# Patient Record
Sex: Female | Born: 1965 | Race: Black or African American | Hispanic: No | Marital: Single | Smoking: Never smoker
Health system: Southern US, Community
[De-identification: ages and names within clinical notes are randomized; demographics above are authoritative.]

## PROBLEM LIST (undated history)

## (undated) DIAGNOSIS — J45909 Unspecified asthma, uncomplicated: Secondary | ICD-10-CM

## (undated) DIAGNOSIS — M549 Dorsalgia, unspecified: Secondary | ICD-10-CM

## (undated) DIAGNOSIS — E782 Mixed hyperlipidemia: Secondary | ICD-10-CM

## (undated) DIAGNOSIS — I10 Essential (primary) hypertension: Secondary | ICD-10-CM

## (undated) DIAGNOSIS — N393 Stress incontinence (female) (male): Secondary | ICD-10-CM

## (undated) HISTORY — DX: Stress incontinence (female) (male): N39.3

## (undated) HISTORY — DX: Mixed hyperlipidemia: E78.2

## (undated) HISTORY — PX: HX PARTIAL HYSTERECTOMY: SHX80

## (undated) HISTORY — PX: HX HYSTERECTOMY: SHX81

## (undated) HISTORY — DX: Dorsalgia, unspecified: M54.9

## (undated) HISTORY — PX: ABDOMINAL HYSTERECTOMY: SHX81

---

## 2005-08-28 ENCOUNTER — Emergency Department: Payer: Self-pay | Admitting: Unknown Physician Specialty

## 2005-08-30 ENCOUNTER — Ambulatory Visit: Payer: Self-pay | Admitting: Unknown Physician Specialty

## 2005-11-19 ENCOUNTER — Emergency Department: Payer: Self-pay | Admitting: Emergency Medicine

## 2006-12-12 ENCOUNTER — Emergency Department: Payer: Self-pay | Admitting: Internal Medicine

## 2007-02-14 ENCOUNTER — Ambulatory Visit: Payer: Self-pay | Admitting: Internal Medicine

## 2009-08-01 ENCOUNTER — Emergency Department: Payer: Self-pay | Admitting: Emergency Medicine

## 2011-10-08 ENCOUNTER — Emergency Department: Payer: Self-pay | Admitting: Emergency Medicine

## 2011-10-08 LAB — CBC WITH DIFFERENTIAL/PLATELET
Basophil #: 0 10*3/uL (ref 0.0–0.1)
Basophil %: 0.2 %
Eosinophil #: 0 10*3/uL (ref 0.0–0.7)
Eosinophil %: 0.6 %
HCT: 40.2 % (ref 35.0–47.0)
HGB: 12.7 g/dL (ref 12.0–16.0)
Lymphocyte #: 1 10*3/uL (ref 1.0–3.6)
Lymphocyte %: 14.2 %
MCH: 26.6 pg (ref 26.0–34.0)
MCHC: 31.7 g/dL — ABNORMAL LOW (ref 32.0–36.0)
MCV: 84 fL (ref 80–100)
Monocyte #: 0.5 x10 3/mm (ref 0.2–0.9)
Monocyte %: 6.7 %
Neutrophil #: 5.4 10*3/uL (ref 1.4–6.5)
Neutrophil %: 78.3 %
Platelet: 332 10*3/uL (ref 150–440)
RBC: 4.78 10*6/uL (ref 3.80–5.20)
RDW: 14.4 % (ref 11.5–14.5)
WBC: 6.9 10*3/uL (ref 3.6–11.0)

## 2011-10-08 LAB — URINALYSIS, COMPLETE
Bacteria: NONE SEEN
Bilirubin,UR: NEGATIVE
Glucose,UR: NEGATIVE mg/dL (ref 0–75)
Ketone: NEGATIVE
Leukocyte Esterase: NEGATIVE
Nitrite: NEGATIVE
Ph: 6 (ref 4.5–8.0)
Protein: NEGATIVE
RBC,UR: 4 /HPF (ref 0–5)
Specific Gravity: 1.008 (ref 1.003–1.030)
Squamous Epithelial: 3
WBC UR: 1 /HPF (ref 0–5)

## 2011-10-08 LAB — COMPREHENSIVE METABOLIC PANEL
Albumin: 3.5 g/dL (ref 3.4–5.0)
Alkaline Phosphatase: 83 U/L (ref 50–136)
Anion Gap: 7 (ref 7–16)
BUN: 6 mg/dL — ABNORMAL LOW (ref 7–18)
Bilirubin,Total: 0.4 mg/dL (ref 0.2–1.0)
Calcium, Total: 8.2 mg/dL — ABNORMAL LOW (ref 8.5–10.1)
Chloride: 106 mmol/L (ref 98–107)
Co2: 26 mmol/L (ref 21–32)
Creatinine: 0.75 mg/dL (ref 0.60–1.30)
EGFR (African American): >60
EGFR (Non-African Amer.): >60
Glucose: 94 mg/dL (ref 65–99)
Osmolality: 275 (ref 275–301)
Potassium: 3.2 mmol/L — ABNORMAL LOW (ref 3.5–5.1)
SGOT(AST): 18 U/L (ref 15–37)
SGPT (ALT): 18 U/L
Sodium: 139 mmol/L (ref 136–145)
Total Protein: 7.3 g/dL (ref 6.4–8.2)

## 2011-10-08 LAB — PREGNANCY, URINE: Pregnancy Test, Urine: NEGATIVE m[IU]/mL

## 2011-10-08 LAB — LIPASE, BLOOD: Lipase: 58 U/L — ABNORMAL LOW (ref 73–393)

## 2011-11-05 ENCOUNTER — Emergency Department: Payer: Self-pay | Admitting: Emergency Medicine

## 2012-01-29 ENCOUNTER — Emergency Department: Payer: Self-pay | Admitting: Emergency Medicine

## 2012-01-29 LAB — CBC
HCT: 37.5 % (ref 35.0–47.0)
HGB: 12.6 g/dL (ref 12.0–16.0)
MCH: 27.7 pg (ref 26.0–34.0)
MCHC: 33.7 g/dL (ref 32.0–36.0)
MCV: 82 fL (ref 80–100)
Platelet: 384 10*3/uL (ref 150–440)
RBC: 4.56 10*6/uL (ref 3.80–5.20)
RDW: 14.1 % (ref 11.5–14.5)
WBC: 9.8 10*3/uL (ref 3.6–11.0)

## 2012-01-29 LAB — COMPREHENSIVE METABOLIC PANEL
Albumin: 3.5 g/dL (ref 3.4–5.0)
Alkaline Phosphatase: 83 U/L (ref 50–136)
Anion Gap: 8 (ref 7–16)
BUN: 11 mg/dL (ref 7–18)
Bilirubin,Total: 0.3 mg/dL (ref 0.2–1.0)
Calcium, Total: 8.2 mg/dL — ABNORMAL LOW (ref 8.5–10.1)
Chloride: 110 mmol/L — ABNORMAL HIGH (ref 98–107)
Co2: 23 mmol/L (ref 21–32)
Creatinine: 0.71 mg/dL (ref 0.60–1.30)
EGFR (African American): >60
EGFR (Non-African Amer.): >60
Glucose: 73 mg/dL (ref 65–99)
Osmolality: 279 (ref 275–301)
Potassium: 3.7 mmol/L (ref 3.5–5.1)
SGOT(AST): 17 U/L (ref 15–37)
SGPT (ALT): 16 U/L (ref 12–78)
Sodium: 141 mmol/L (ref 136–145)
Total Protein: 7.4 g/dL (ref 6.4–8.2)

## 2012-01-29 LAB — HCG, QUANTITATIVE, PREGNANCY: Beta Hcg, Quant.: <1 m[IU]/mL — ABNORMAL LOW

## 2012-02-21 ENCOUNTER — Emergency Department: Payer: Self-pay | Admitting: Emergency Medicine

## 2012-02-21 LAB — CBC
HCT: 37.3 % (ref 35.0–47.0)
HGB: 12.3 g/dL (ref 12.0–16.0)
MCH: 27.2 pg (ref 26.0–34.0)
MCHC: 33 g/dL (ref 32.0–36.0)
MCV: 82 fL (ref 80–100)
Platelet: 346 10*3/uL (ref 150–440)
RBC: 4.52 10*6/uL (ref 3.80–5.20)
RDW: 13.8 % (ref 11.5–14.5)
WBC: 10.3 10*3/uL (ref 3.6–11.0)

## 2012-02-21 LAB — COMPREHENSIVE METABOLIC PANEL
Albumin: 3.7 g/dL (ref 3.4–5.0)
Alkaline Phosphatase: 76 U/L (ref 50–136)
Anion Gap: 8 (ref 7–16)
BUN: 7 mg/dL (ref 7–18)
Bilirubin,Total: 0.3 mg/dL (ref 0.2–1.0)
Calcium, Total: 8.9 mg/dL (ref 8.5–10.1)
Chloride: 108 mmol/L — ABNORMAL HIGH (ref 98–107)
Co2: 24 mmol/L (ref 21–32)
Creatinine: 0.63 mg/dL (ref 0.60–1.30)
EGFR (African American): >60
EGFR (Non-African Amer.): >60
Glucose: 85 mg/dL (ref 65–99)
Osmolality: 277 (ref 275–301)
Potassium: 3.8 mmol/L (ref 3.5–5.1)
SGOT(AST): 12 U/L — ABNORMAL LOW (ref 15–37)
SGPT (ALT): 18 U/L (ref 12–78)
Sodium: 140 mmol/L (ref 136–145)
Total Protein: 7.8 g/dL (ref 6.4–8.2)

## 2012-02-21 LAB — LIPASE, BLOOD: Lipase: 80 U/L (ref 73–393)

## 2012-02-21 LAB — TROPONIN I: Troponin-I: <0.02 ng/mL

## 2012-02-22 LAB — URINALYSIS, COMPLETE
Bacteria: NONE SEEN
Bilirubin,UR: NEGATIVE
Glucose,UR: NEGATIVE mg/dL (ref 0–75)
Leukocyte Esterase: NEGATIVE
Nitrite: NEGATIVE
Ph: 6 (ref 4.5–8.0)
Protein: NEGATIVE
RBC,UR: 28 /HPF (ref 0–5)
Specific Gravity: 1.026 (ref 1.003–1.030)
Squamous Epithelial: 5
WBC UR: 1 /HPF (ref 0–5)

## 2012-08-24 DIAGNOSIS — D219 Benign neoplasm of connective and other soft tissue, unspecified: Secondary | ICD-10-CM | POA: Insufficient documentation

## 2012-08-24 DIAGNOSIS — N939 Abnormal uterine and vaginal bleeding, unspecified: Secondary | ICD-10-CM | POA: Insufficient documentation

## 2012-09-08 DIAGNOSIS — K5900 Constipation, unspecified: Secondary | ICD-10-CM | POA: Insufficient documentation

## 2013-01-14 ENCOUNTER — Emergency Department: Payer: Self-pay | Admitting: Internal Medicine

## 2013-05-11 ENCOUNTER — Emergency Department: Payer: Self-pay | Admitting: Emergency Medicine

## 2013-05-11 LAB — URINALYSIS, COMPLETE
Bacteria: NONE SEEN
Bilirubin,UR: NEGATIVE
Glucose,UR: NEGATIVE mg/dL (ref 0–75)
Ketone: NEGATIVE
Leukocyte Esterase: NEGATIVE
Nitrite: NEGATIVE
Ph: 6 (ref 4.5–8.0)
Protein: NEGATIVE
RBC,UR: 12 /HPF (ref 0–5)
Specific Gravity: 1.024 (ref 1.003–1.030)
Squamous Epithelial: <1
WBC UR: <1 /HPF (ref 0–5)

## 2013-05-11 LAB — CBC
HCT: 35.5 % (ref 35.0–47.0)
HGB: 11.8 g/dL — ABNORMAL LOW (ref 12.0–16.0)
MCH: 27.2 pg (ref 26.0–34.0)
MCHC: 33.3 g/dL (ref 32.0–36.0)
MCV: 82 fL (ref 80–100)
Platelet: 293 10*3/uL (ref 150–440)
RBC: 4.35 10*6/uL (ref 3.80–5.20)
RDW: 14.2 % (ref 11.5–14.5)
WBC: 9.2 10*3/uL (ref 3.6–11.0)

## 2013-05-11 LAB — GC/CHLAMYDIA PROBE AMP

## 2013-05-11 LAB — WET PREP, GENITAL

## 2013-07-01 ENCOUNTER — Emergency Department: Payer: Self-pay | Admitting: Emergency Medicine

## 2013-07-01 LAB — WET PREP, GENITAL

## 2013-07-01 LAB — URINALYSIS, COMPLETE
Bilirubin,UR: NEGATIVE
Glucose,UR: NEGATIVE mg/dL (ref 0–75)
Ketone: NEGATIVE
Nitrite: NEGATIVE
Ph: 6 (ref 4.5–8.0)
Protein: 30
RBC,UR: 413 /HPF (ref 0–5)
Specific Gravity: 1.016 (ref 1.003–1.030)
Squamous Epithelial: 5
WBC UR: 118 /HPF (ref 0–5)

## 2013-07-01 LAB — GC/CHLAMYDIA PROBE AMP

## 2013-07-03 LAB — URINE CULTURE

## 2014-07-23 ENCOUNTER — Emergency Department
Admit: 2014-07-23 | Disposition: A | Discharge status: Home / Self Care | Payer: Self-pay | Admitting: Emergency Medicine

## 2014-08-28 ENCOUNTER — Encounter: Payer: Self-pay | Admitting: Emergency Medicine

## 2014-08-28 ENCOUNTER — Emergency Department
Admission: EM | Admit: 2014-08-28 | Discharge: 2014-08-28 | Disposition: A | Discharge status: Home / Self Care | Payer: Self-pay | Attending: Emergency Medicine | Admitting: Emergency Medicine

## 2014-08-28 DIAGNOSIS — S30814A Abrasion of vagina and vulva, initial encounter: Secondary | ICD-10-CM | POA: Insufficient documentation

## 2014-08-28 DIAGNOSIS — S39012A Strain of muscle, fascia and tendon of lower back, initial encounter: Secondary | ICD-10-CM | POA: Insufficient documentation

## 2014-08-28 DIAGNOSIS — X58XXXA Exposure to other specified factors, initial encounter: Secondary | ICD-10-CM | POA: Insufficient documentation

## 2014-08-28 DIAGNOSIS — Y998 Other external cause status: Secondary | ICD-10-CM | POA: Insufficient documentation

## 2014-08-28 DIAGNOSIS — Y9389 Activity, other specified: Secondary | ICD-10-CM | POA: Insufficient documentation

## 2014-08-28 DIAGNOSIS — Y9289 Other specified places as the place of occurrence of the external cause: Secondary | ICD-10-CM | POA: Insufficient documentation

## 2014-08-28 DIAGNOSIS — Y9301 Activity, walking, marching and hiking: Secondary | ICD-10-CM | POA: Insufficient documentation

## 2014-08-28 DIAGNOSIS — N9089 Other specified noninflammatory disorders of vulva and perineum: Secondary | ICD-10-CM | POA: Insufficient documentation

## 2014-08-28 DIAGNOSIS — N898 Other specified noninflammatory disorders of vagina: Secondary | ICD-10-CM | POA: Insufficient documentation

## 2014-08-28 HISTORY — DX: Unspecified asthma, uncomplicated: J45.909

## 2014-08-28 LAB — URINALYSIS COMPLETE WITH MICROSCOPIC (ARMC ONLY)
Bacteria, UA: NONE SEEN
Bilirubin Urine: NEGATIVE
Glucose, UA: NEGATIVE mg/dL
Ketones, ur: NEGATIVE mg/dL
Leukocytes, UA: NEGATIVE
Nitrite: NEGATIVE
Protein, ur: NEGATIVE mg/dL
Specific Gravity, Urine: 1.013 (ref 1.005–1.030)
pH: 6 (ref 5.0–8.0)

## 2014-08-28 LAB — WET PREP, GENITAL
Clue Cells Wet Prep HPF POC: NONE SEEN
Trich, Wet Prep: NONE SEEN
Yeast Wet Prep HPF POC: NONE SEEN

## 2014-08-28 MED ORDER — KETOROLAC TROMETHAMINE 10 MG PO TABS
20.0000 mg | ORAL_TABLET | Freq: Once | ORAL | Status: AC
  Administered 2014-08-28: 20 mg via ORAL

## 2014-08-28 MED ORDER — KETOROLAC TROMETHAMINE 10 MG PO TABS
ORAL_TABLET | ORAL | Status: AC
  Administered 2014-08-28: 20 mg via ORAL
  Filled 2014-08-28: qty 2

## 2014-08-28 MED ORDER — NAPROXEN 500 MG PO TBEC: 500.0000 mg | DELAYED_RELEASE_TABLET | Freq: Two times a day (BID) | ORAL | Status: DC

## 2014-08-28 NOTE — ED Notes (Signed)
Having lower back pain which radiates into right leg

## 2014-08-28 NOTE — ED Provider Notes (Signed)
[facility] Emergency Department Provider Note ?____________________________________________ ? Time seen: 6:18 PM on 08/28/2014 -----------------------------------------  I have reviewed the triage vital signs and the nursing notes. ________ HISTORY ? Chief Complaint Back Pain  HPI  Jacqueline Sanders is a 49 y.o. female with complaints of low back pain, that are worsened by walking over the last 2 weeks. She denies any specific injury or trauma, but was told by her therapist, that it may have been due to tension and spasms while sleeping. She denies dysuria, leg weakness, or distal paresthesias. She also was treated with complaint of noting some spotting of bright red blood on her incontinence pad today when she went to the bathroom. She denies any urinary symptoms, denies any recent sexual encounters, and denies any vulvar lesions or rashes. She is status post hysterectomy, so denies any abnormal vaginal bleeding at this time.  Past Medical History  Diagnosis Date  . Asthma    There are no active problems to display for this patient. ? History reviewed. No pertinent past surgical history. ? Current Outpatient Rx  Name  Route  Sig  Dispense  Refill  . naproxen (EC NAPROSYN) 500 MG EC tablet   Oral   Take 1 tablet (500 mg total) by mouth 2 (two) times daily with a meal.   30 tablet   0   ? Allergies Sulfa antibiotics ? History reviewed. No pertinent family history. ? Social History History  Substance Use Topics  . Smoking status: Never Smoker   . Smokeless tobacco: Not on file  . Alcohol Use: No   Review of Systems  Constitutional: Negative for fever. HEENT: Negative for head trauma, visual changes, sore throat. Cardiovascular: Negative for chest pain. Respiratory: Negative for shortness of breath. Musculoskeletal: Positive for back pain. Genitourinary: Denies dysuria, noted scant, BRB on pad Skin: Negative for rash. Neurological: Negative for  headaches, focal weakness or numbness.  10-point ROS otherwise negative. ____________________________________________  PHYSICAL EXAM:  VITAL SIGNS: ED Triage Vitals  Enc Vitals Group     BP 08/28/14 1731 160/95 mmHg     Pulse Rate 08/28/14 1731 67     Resp 08/28/14 1731 18     Temp 08/28/14 1731 97.7 F (36.5 C)     Temp Source 08/28/14 1731 Oral     SpO2 08/28/14 1731 99 %     Weight 08/28/14 1733 272 lb (123.378 kg)     Height 08/28/14 1731 5\' 8"  (1.727 m)     Head Cir --      Peak Flow --      Pain Score 08/28/14 1732 8     Pain Loc --      Pain Edu? --      Excl. in [place]? --    Constitutional: Alert and oriented. Well appearing and in no distress. HEENT:Normocephalic and atraumatic.  PERRL. Normal extraocular movements.  No congestion/rhinnorhea. Mucous membranes are moist. Neck: Supple. No cervical lymphadenopathy. Cardiovascular: Normal rate, regular rhythm. No murmurs, rubs, or gallops. Normal and symmetric distal pulses are present in all extremities.  Respiratory: Normal respiratory effort without tachypnea. Breath sounds are clear and equal bilaterally. No wheezes/rales/rhonchi. Gastrointestinal: Soft and nontender. No distention. No abdominal bruits. There is no CVA tenderness. Genitourinary: Normal external exam except for linear vulvar abrasion on the left labia minora. Pelvic exam reveals white vaginal discharge in the canal. No active bleeding or lesions noted. Wet prep collected. Musculoskeletal: Nontender with normal range of motion in all extremities. No  joint effusions.  No lower extremity tenderness nor edema. Lumbar spine without deformity, spasm, or step-off.  Mildly tender to palp along the lumbar paraspinals.  Neurologic:  Normal speech and language. CN II-XII grossly intact. No gait instability. Normal LE DTRs bilaterally.  Skin:  Skin is warm, dry and intact. No rash noted. Psychiatric: Mood and affect are normal. Patient exhibits appropriate insight and  judgment. _____ LABS  Labs Reviewed  WET PREP, GENITAL - Abnormal; Notable for the following:    WBC, Wet Prep HPF POC FEW (*)    All other components within normal limits  URINALYSIS COMPLETEWITH MICROSCOPIC ([facility])  - Abnormal; Notable for the following:    Color, Urine STRAW (*)    APPearance CLEAR (*)    Hgb urine dipstick 2+ (*)    Squamous Epithelial / LPF 0-5 (*)    All other components within normal limits  _____________ PROCEDURES ? Procedure(s) performed: None  Critical Care performed: None ______________________________________________________ INITIAL IMPRESSION / ASSESSMENT AND PLAN / ED COURSE ? Mechanical lumbar back pain s/p strain.  Minor vulvar abrasion, likely source of resolved vulvar bleeding. Negative wet prep & urine results to patient.    Pertinent labs & imaging results that were available during my care of the patient were reviewed by me and considered in my medical decision making (see chart for details).  ____________________________________________ FINAL CLINICAL IMPRESSION(S) / ED DIAGNOSES?  Final diagnoses:  Lumbar strain, initial encounter  Vulvar irritation      Melvenia Needles, PA-C 08/28/14 2045  Ahmed Prima, MD 08/29/14 [phone]

## 2014-08-28 NOTE — Discharge Instructions (Signed)
Lumbosacral Strain Lumbosacral strain is a strain of any of the parts that make up your lumbosacral vertebrae. Your lumbosacral vertebrae are the bones that make up the lower third of your backbone. Your lumbosacral vertebrae are held together by muscles and tough, fibrous tissue (ligaments).  CAUSES  A sudden blow to your back can cause lumbosacral strain. Also, anything that causes an excessive stretch of the muscles in the low back can cause this strain. This is typically seen when people exert themselves strenuously, fall, lift heavy objects, bend, or crouch repeatedly. RISK FACTORS  Physically demanding work.  Participation in pushing or pulling sports or sports that require a sudden twist of the back (tennis, golf, baseball).  Weight lifting.  Excessive lower back curvature.  Forward-tilted pelvis.  Weak back or abdominal muscles or both.  Tight hamstrings. SIGNS AND SYMPTOMS  Lumbosacral strain may cause pain in the area of your injury or pain that moves (radiates) down your leg.  DIAGNOSIS Your health care provider can often diagnose lumbosacral strain through a physical exam. In some cases, you may need tests such as X-ray exams.  TREATMENT  Treatment for your lower back injury depends on many factors that your clinician will have to evaluate. However, most treatment will include the use of anti-inflammatory medicines. HOME CARE INSTRUCTIONS   Avoid hard physical activities (tennis, racquetball, waterskiing) if you are not in proper physical condition for it. This may aggravate or create problems.  If you have a back problem, avoid sports requiring sudden body movements. Swimming and walking are generally safer activities.  Maintain good posture.  Maintain a healthy weight.  For acute conditions, you may put ice on the injured area.  Put ice in a plastic bag.  Place a towel between your skin and the bag.  Leave the ice on for 20 minutes, 2-3 times a day.  When the  low back starts healing, stretching and strengthening exercises may be recommended. SEEK MEDICAL CARE IF:  Your back pain is getting worse.  You experience severe back pain not relieved with medicines. SEEK IMMEDIATE MEDICAL CARE IF:   You have numbness, tingling, weakness, or problems with the use of your arms or legs.  There is a change in bowel or bladder control.  You have increasing pain in any area of the body, including your belly (abdomen).  You notice shortness of breath, dizziness, or feel faint.  You feel sick to your stomach (nauseous), are throwing up (vomiting), or become sweaty.  You notice discoloration of your toes or legs, or your feet get very cold. MAKE SURE YOU:   Understand these instructions.  Will watch your condition.  Will get help right away if you are not doing well or get worse. Document Released: 01/06/2005 Document Revised: 04/03/2013 Document Reviewed: 11/15/2012 [facility] Patient Information 2015 [place], [place]. This information is not intended to replace advice given to you by your health care provider. Make sure you discuss any questions you have with your health care provider.   Your exam and labs were normal today.  You should take the prescription med as directed.  Apply ice or moist heat as needed.  Follow-up with one of the community clinics for ongoing problems.

## 2014-08-28 NOTE — ED Notes (Signed)
Lower back pain, worse with walking; X 2 weeks-progressively worsening. Pt alert and oriented X4, active, cooperative, pt in NAD. RR even and unlabored, color WNL.

## 2015-02-14 ENCOUNTER — Emergency Department
Admission: EM | Admit: 2015-02-14 | Discharge: 2015-02-14 | Disposition: A | Discharge status: Home / Self Care | Payer: Self-pay | Attending: Emergency Medicine | Admitting: Emergency Medicine

## 2015-02-14 ENCOUNTER — Encounter: Payer: Self-pay | Admitting: *Deleted

## 2015-02-14 DIAGNOSIS — Z791 Long term (current) use of non-steroidal anti-inflammatories (NSAID): Secondary | ICD-10-CM | POA: Insufficient documentation

## 2015-02-14 DIAGNOSIS — R197 Diarrhea, unspecified: Secondary | ICD-10-CM

## 2015-02-14 DIAGNOSIS — R109 Unspecified abdominal pain: Secondary | ICD-10-CM | POA: Insufficient documentation

## 2015-02-14 LAB — COMPREHENSIVE METABOLIC PANEL
ALT: 17 U/L (ref 14–54)
AST: 18 U/L (ref 15–41)
Albumin: 3.7 g/dL (ref 3.5–5.0)
Alkaline Phosphatase: 76 U/L (ref 38–126)
Anion gap: 6 (ref 5–15)
BUN: 9 mg/dL (ref 6–20)
CO2: 28 mmol/L (ref 22–32)
Calcium: 8.9 mg/dL (ref 8.9–10.3)
Chloride: 105 mmol/L (ref 101–111)
Creatinine, Ser: 0.61 mg/dL (ref 0.44–1.00)
GFR calc Af Amer: >60 mL/min (ref >60)
GFR calc non Af Amer: >60 mL/min (ref >60)
Glucose, Bld: 68 mg/dL (ref 65–99)
Potassium: 3.9 mmol/L (ref 3.5–5.1)
Sodium: 139 mmol/L (ref 135–145)
Total Bilirubin: 0.4 mg/dL (ref 0.3–1.2)
Total Protein: 7.4 g/dL (ref 6.5–8.1)

## 2015-02-14 LAB — CBC
HCT: 35.8 % (ref 35.0–47.0)
Hemoglobin: 11.9 g/dL — ABNORMAL LOW (ref 12.0–16.0)
MCH: 27.3 pg (ref 26.0–34.0)
MCHC: 33.2 g/dL (ref 32.0–36.0)
MCV: 82.3 fL (ref 80.0–100.0)
Platelets: 326 10*3/uL (ref 150–440)
RBC: 4.35 MIL/uL (ref 3.80–5.20)
RDW: 14.1 % (ref 11.5–14.5)
WBC: 8.2 10*3/uL (ref 3.6–11.0)

## 2015-02-14 LAB — URINALYSIS COMPLETE WITH MICROSCOPIC (ARMC ONLY)
Bacteria, UA: NONE SEEN
Bilirubin Urine: NEGATIVE
Glucose, UA: NEGATIVE mg/dL
Ketones, ur: NEGATIVE mg/dL
Leukocytes, UA: NEGATIVE
Nitrite: NEGATIVE
Protein, ur: NEGATIVE mg/dL
Specific Gravity, Urine: 1.008 (ref 1.005–1.030)
pH: 6 (ref 5.0–8.0)

## 2015-02-14 LAB — LIPASE, BLOOD: Lipase: 25 U/L (ref 11–51)

## 2015-02-14 MED ORDER — LOPERAMIDE HCL 2 MG PO TABS: 2.0000 mg | ORAL_TABLET | Freq: Four times a day (QID) | ORAL | Status: DC | PRN

## 2015-02-14 NOTE — ED Notes (Signed)
Pt reports diarrhea that started today with abdominal cramping.

## 2015-02-14 NOTE — ED Provider Notes (Signed)
[facility] Emergency Department Provider Note  ____________________________________________  Time seen: On arrival  I have reviewed the triage vital signs and the nursing notes.   HISTORY  Chief Complaint Diarrhea    HPI Jacqueline Sanders is a 49 y.o. female who presents with complaints of diarrhea that started approximately 24 hours ago. She reports watery diarrhea without blood. She complains of mild abdominal cramping which is relieved by diarrhea. She denies nausea or vomiting. No fevers or chills. No recent travel or camping. She reports she has had this many times in the past. She has not recently been on antibiotics.She has not tried anything for this     Past Medical History  Diagnosis Date  . Asthma     There are no active problems to display for this patient.   Past Surgical History  Procedure Laterality Date  . Abdominal hysterectomy      Current Outpatient Rx  Name  Route  Sig  Dispense  Refill  . loperamide (IMODIUM A-D) 2 MG tablet   Oral   Take 1 tablet (2 mg total) by mouth 4 (four) times daily as needed for diarrhea or loose stools.   30 tablet   0   . naproxen (EC NAPROSYN) 500 MG EC tablet   Oral   Take 1 tablet (500 mg total) by mouth 2 (two) times daily with a meal.   30 tablet   0     Allergies Sulfa antibiotics  History reviewed. No pertinent family history.  Social History Social History  Substance Use Topics  . Smoking status: Never Smoker   . Smokeless tobacco: None  . Alcohol Use: No    Review of Systems  Constitutional: Negative for fever. Eyes: Negative for visual changes. ENT: Negative for sore throat Cardiovascular: Negative for chest pain. Respiratory: Negative for shortness of breath. Gastrointestinal: Negative for vomiting or nausea Genitourinary: Negative for dysuria. Musculoskeletal: Negative for back pain. Skin: Negative for rash. Neurological: Negative for headaches or focal  weakness Psychiatric: No anxiety    ____________________________________________   PHYSICAL EXAM:  VITAL SIGNS: ED Triage Vitals  Enc Vitals Group     BP 02/14/15 1900 170/97 mmHg     Pulse Rate 02/14/15 1900 66     Resp 02/14/15 1900 17     Temp 02/14/15 1900 98.2 F (36.8 C)     Temp Source 02/14/15 1900 Oral     SpO2 02/14/15 1900 100 %     Weight 02/14/15 1900 275 lb (124.739 kg)     Height 02/14/15 1900 5\' 8"  (1.727 m)     Head Cir --      Peak Flow --      Pain Score 02/14/15 1906 10     Pain Loc --      Pain Edu? --      Excl. in [place]? --      Constitutional: Alert and oriented. Well appearing and in no distress. Eyes: Conjunctivae are normal.  ENT   Head: Normocephalic and atraumatic.   Mouth/Throat: Mucous membranes are moist. Cardiovascular: Normal rate, regular rhythm. Normal and symmetric distal pulses are present in all extremities. No murmurs, rubs, or gallops. Respiratory: Normal respiratory effort without tachypnea nor retractions. Breath sounds are clear and equal bilaterally.  Gastrointestinal: Soft and non-tender in all quadrants. No distention. There is no CVA tenderness. Genitourinary: deferred Musculoskeletal: Nontender with normal range of motion in all extremities. No lower extremity tenderness nor edema. Neurologic:  Normal speech and  language. No gross focal neurologic deficits are appreciated. Skin:  Skin is warm, dry and intact. No rash noted. Psychiatric: Mood and affect are normal. Patient exhibits appropriate insight and judgment.  ____________________________________________    LABS (pertinent positives/negatives)  Labs Reviewed  CBC - Abnormal; Notable for the following:    Hemoglobin 11.9 (*)    All other components within normal limits  URINALYSIS COMPLETEWITH MICROSCOPIC ([facility] ONLY) - Abnormal; Notable for the following:    Color, Urine STRAW (*)    APPearance CLEAR (*)    Hgb urine dipstick 2+ (*)    Squamous  Epithelial / LPF 0-5 (*)    All other components within normal limits  LIPASE, BLOOD  COMPREHENSIVE METABOLIC PANEL    ____________________________________________   EKG  None  ____________________________________________    RADIOLOGY I have personally reviewed any xrays that were ordered on this patient: None  ____________________________________________   PROCEDURES  Procedure(s) performed: none  Critical Care performed: none  ____________________________________________   INITIAL IMPRESSION / ASSESSMENT AND PLAN / ED COURSE  Pertinent labs & imaging results that were available during my care of the patient were reviewed by me and considered in my medical decision making (see chart for details).  Patient well-appearing and in no distress. She has no tenderness palpation of her abdomen and her lab work all looks normal. I recommended supportive treatment at this time, I will write a prescription for Imodium and asked her to follow up with her primary care physician if she continues to have diarrhea  ____________________________________________   FINAL CLINICAL IMPRESSION(S) / ED DIAGNOSES  Final diagnoses:  Diarrhea, unspecified type     Lavonia Drafts, MD 02/14/15 2116

## 2015-02-14 NOTE — ED Notes (Signed)
Pt reports diarrhea w/ abd cramping x 2 days

## 2015-02-14 NOTE — Discharge Instructions (Signed)

## 2015-04-20 ENCOUNTER — Encounter: Payer: Self-pay | Admitting: Emergency Medicine

## 2015-04-20 ENCOUNTER — Emergency Department
Admission: EM | Admit: 2015-04-20 | Discharge: 2015-04-21 | Disposition: A | Discharge status: Home / Self Care | Payer: BLUE CROSS/BLUE SHIELD | Attending: Emergency Medicine | Admitting: Emergency Medicine

## 2015-04-20 DIAGNOSIS — Z791 Long term (current) use of non-steroidal anti-inflammatories (NSAID): Secondary | ICD-10-CM | POA: Diagnosis not present

## 2015-04-20 DIAGNOSIS — R1030 Lower abdominal pain, unspecified: Secondary | ICD-10-CM

## 2015-04-20 DIAGNOSIS — R42 Dizziness and giddiness: Secondary | ICD-10-CM | POA: Insufficient documentation

## 2015-04-20 DIAGNOSIS — A09 Infectious gastroenteritis and colitis, unspecified: Secondary | ICD-10-CM | POA: Diagnosis not present

## 2015-04-20 LAB — COMPREHENSIVE METABOLIC PANEL
ALT: 14 U/L (ref 14–54)
AST: 15 U/L (ref 15–41)
Albumin: 3.6 g/dL (ref 3.5–5.0)
Alkaline Phosphatase: 74 U/L (ref 38–126)
Anion gap: 6 (ref 5–15)
BUN: 8 mg/dL (ref 6–20)
CO2: 26 mmol/L (ref 22–32)
Calcium: 8 mg/dL — ABNORMAL LOW (ref 8.9–10.3)
Chloride: 106 mmol/L (ref 101–111)
Creatinine, Ser: 0.58 mg/dL (ref 0.44–1.00)
GFR calc Af Amer: >60 mL/min (ref >60)
GFR calc non Af Amer: >60 mL/min (ref >60)
Glucose, Bld: 98 mg/dL (ref 65–99)
Potassium: 3.2 mmol/L — ABNORMAL LOW (ref 3.5–5.1)
Sodium: 138 mmol/L (ref 135–145)
Total Bilirubin: 0.7 mg/dL (ref 0.3–1.2)
Total Protein: 7 g/dL (ref 6.5–8.1)

## 2015-04-20 LAB — CBC
HCT: 35.9 % (ref 35.0–47.0)
Hemoglobin: 11.9 g/dL — ABNORMAL LOW (ref 12.0–16.0)
MCH: 27.3 pg (ref 26.0–34.0)
MCHC: 33.1 g/dL (ref 32.0–36.0)
MCV: 82.2 fL (ref 80.0–100.0)
Platelets: 276 10*3/uL (ref 150–440)
RBC: 4.36 MIL/uL (ref 3.80–5.20)
RDW: 13.9 % (ref 11.5–14.5)
WBC: 6.2 10*3/uL (ref 3.6–11.0)

## 2015-04-20 LAB — URINALYSIS COMPLETE WITH MICROSCOPIC (ARMC ONLY)
Bilirubin Urine: NEGATIVE
Glucose, UA: NEGATIVE mg/dL
Ketones, ur: NEGATIVE mg/dL
Leukocytes, UA: NEGATIVE
Nitrite: NEGATIVE
Protein, ur: NEGATIVE mg/dL
Specific Gravity, Urine: 1.013 (ref 1.005–1.030)
pH: 6 (ref 5.0–8.0)

## 2015-04-20 LAB — LIPASE, BLOOD: Lipase: 20 U/L (ref 11–51)

## 2015-04-20 MED ORDER — SODIUM CHLORIDE 0.9 % IV BOLUS (SEPSIS)
1000.0000 mL | Freq: Once | INTRAVENOUS | Status: AC
  Administered 2015-04-21: 1000 mL via INTRAVENOUS

## 2015-04-20 NOTE — ED Notes (Addendum)
Pt c/o abdominal cramping since 2100 last night with diarrhea and nausea; lightheaded at times when first standing; denies urinary s/s; pt awake and alert; talking in complete coherent sentences; took prescrbed Imodiuim about 20 min pta

## 2015-04-20 NOTE — ED Provider Notes (Signed)
[facility] Emergency Department Provider Note  ____________________________________________  Time seen: Approximately 11:31 PM  I have reviewed the triage vital signs and the nursing notes.   HISTORY  Chief Complaint Abdominal Pain; Diarrhea; and Dizziness    HPI Jacqueline Sanders is a 50 y.o. female who comes into the hospital today with diarrhea and severe abdominal cramps. The patient reports that this started last night around 9:30 and she is unsure she ate anything bad. The patient reports she had made some chili and took a shower after eating. She reports that while showering the cramps started as well as diarrhea. The patient reports that she's had 10-15 episodes of diarrhea since last night. It was initially solid but turned up your liquid and water. The patient has been nauseous but has been able to eat some chicken noodle soup with ginger ale and eat her rate. She reports that she has not drank much and she does feel lightheaded when she gets up. The patient reports that the pain is stabbing in her lower abdomen and she rates the pain a 9 of 10 in intensity. The patient denies any vomiting. The patient also denies fevers. She has taken Imodium twice and she reports that it has not helped to calm the cramps or the diarrhea.   Past Medical History  Diagnosis Date  . Asthma     There are no active problems to display for this patient.   Past Surgical History  Procedure Laterality Date  . Abdominal hysterectomy      Current Outpatient Rx  Name  Route  Sig  Dispense  Refill  . ciprofloxacin (CIPRO) 500 MG tablet   Oral   Take 1 tablet (500 mg total) by mouth 2 (two) times daily.   6 tablet   0   . loperamide (IMODIUM A-D) 2 MG tablet   Oral   Take 1 tablet (2 mg total) by mouth 4 (four) times daily as needed for diarrhea or loose stools.   30 tablet   0   . naproxen (EC NAPROSYN) 500 MG EC tablet   Oral   Take 1 tablet (500 mg total) by  mouth 2 (two) times daily with a meal.   30 tablet   0     Allergies Sulfa antibiotics  History reviewed. No pertinent family history.  Social History Social History  Substance Use Topics  . Smoking status: Never Smoker   . Smokeless tobacco: None  . Alcohol Use: No    Review of Systems Constitutional: No fever/chills Eyes: No visual changes. ENT: No sore throat. Cardiovascular: Denies chest pain. Respiratory: Denies shortness of breath. Gastrointestinal:abdominal pain,  nausea, and  Diarrhea, no vomiting.  Genitourinary: Negative for dysuria. Musculoskeletal: Negative for back pain. Skin: Negative for rash. Neurological: Lightheadedness  10-point ROS otherwise negative.  ____________________________________________   PHYSICAL EXAM:  VITAL SIGNS: ED Triage Vitals  Enc Vitals Group     BP 04/20/15 2154 149/88 mmHg     Pulse Rate 04/20/15 2154 83     Resp 04/20/15 2154 18     Temp 04/20/15 2154 98 F (36.7 C)     Temp Source 04/20/15 2154 Oral     SpO2 04/20/15 2154 100 %     Weight 04/20/15 2154 277 lb 8 oz (125.873 kg)     Height 04/20/15 2154 5\' 8"  (1.727 m)     Head Cir --      Peak Flow --      Pain  Score 04/20/15 2155 5     Pain Loc --      Pain Edu? --      Excl. in [place]? --     Constitutional: Alert and oriented. Well appearing and in moderate distress. Eyes: Conjunctivae are normal. PERRL. EOMI. Head: Atraumatic. Nose: No congestion/rhinnorhea. Mouth/Throat: Mucous membranes are moist.  Oropharynx non-erythematous. Cardiovascular: Normal rate, regular rhythm. Grossly normal heart sounds.  Good peripheral circulation. Respiratory: Normal respiratory effort.  No retractions. Lungs CTAB. Gastrointestinal: Soft with lower abdomen tenderness to palpation. No distention. Positive bowel sounds Musculoskeletal: No lower extremity tenderness nor edema.  . Neurologic:  Normal speech and language.  Skin:  Skin is warm, dry and intact.  Psychiatric: Mood  and affect are normal.   ____________________________________________   LABS (all labs ordered are listed, but only abnormal results are displayed)  Labs Reviewed  COMPREHENSIVE METABOLIC PANEL - Abnormal; Notable for the following:    Potassium 3.2 (*)    Calcium 8.0 (*)    All other components within normal limits  CBC - Abnormal; Notable for the following:    Hemoglobin 11.9 (*)    All other components within normal limits  URINALYSIS COMPLETEWITH MICROSCOPIC ([facility] ONLY) - Abnormal; Notable for the following:    Color, Urine YELLOW (*)    APPearance CLEAR (*)    Hgb urine dipstick 3+ (*)    Bacteria, UA RARE (*)    Squamous Epithelial / LPF 0-5 (*)    All other components within normal limits  LIPASE, BLOOD   ____________________________________________  EKG  None ____________________________________________  RADIOLOGY  CT abdomen and pelvis: No acute abnormality in the abdomen or pelvis, well-circumscribed fluid density abutting small bowel loops and transverse colon, no prior exams but ultrasound from 2008 performed to evaluate cysts in the region of transverse colon. Minimal diverticulosis without diverticulitis. ____________________________________________   PROCEDURES  Procedure(s) performed: None  Critical Care performed: No  ____________________________________________   INITIAL IMPRESSION / ASSESSMENT AND PLAN / ED COURSE  Pertinent labs & imaging results that were available during my care of the patient were reviewed by me and considered in my medical decision making (see chart for details).  This is a 50 year old female who comes in to the hospital today with abdominal cramping and diarrhea that going on for the past 24 hours. I will give the patient a liter of normal saline and check orthostatic vital signs. Once the patient's blood results have returned I will reexamine the patient and determine if she needs any imaging to evaluate her  symptoms.  After some normal saline the patient reports that she does feel improved. I will discharge the patient home with a short course of ciprofloxacin to help with her diarrhea. The patient will be discharged home to follow-up with her primary care physician. ____________________________________________   FINAL CLINICAL IMPRESSION(S) / ED DIAGNOSES  Final diagnoses:  Diarrhea of infectious origin  Lower abdominal pain      Loney Hering, MD 04/21/15 [phone]

## 2015-04-21 ENCOUNTER — Encounter: Payer: Self-pay | Admitting: Radiology

## 2015-04-21 ENCOUNTER — Emergency Department: Payer: BLUE CROSS/BLUE SHIELD

## 2015-04-21 MED ORDER — CIPROFLOXACIN HCL 500 MG PO TABS: 500.0000 mg | ORAL_TABLET | Freq: Two times a day (BID) | ORAL | Status: AC

## 2015-04-21 MED ORDER — IOHEXOL 240 MG/ML SOLN
25.0000 mL | Freq: Once | INTRAMUSCULAR | Status: AC | PRN
  Administered 2015-04-21: 25 mL via ORAL

## 2015-04-21 MED ORDER — IOHEXOL 300 MG/ML  SOLN
100.0000 mL | Freq: Once | INTRAMUSCULAR | Status: AC | PRN
  Administered 2015-04-21: 100 mL via INTRAVENOUS

## 2015-04-21 NOTE — ED Notes (Signed)
Pt using callbell; says she's finished oral contrast for CT; radiology tech notified of same;

## 2015-04-21 NOTE — Discharge Instructions (Signed)
Abdominal Pain, Adult °Many things can cause abdominal pain. Usually, abdominal pain is not caused by a disease and will improve without treatment. It can often be observed and treated at home. Your health care provider will do a physical exam and possibly order blood tests and X-rays to help determine the seriousness of your pain. However, in many cases, more time must pass before a clear cause of the pain can be found. Before that point, your health care provider may not know if you need more testing or further treatment. °HOME CARE INSTRUCTIONS °Monitor your abdominal pain for any changes. The following actions may help to alleviate any discomfort you are experiencing: °· Only take over-the-counter or prescription medicines as directed by your health care provider. °· Do not take laxatives unless directed to do so by your health care provider. °· Try a clear liquid diet (broth, tea, or water) as directed by your health care provider. Slowly move to a bland diet as tolerated. °SEEK MEDICAL CARE IF: °· You have unexplained abdominal pain. °· You have abdominal pain associated with nausea or diarrhea. °· You have pain when you urinate or have a bowel movement. °· You experience abdominal pain that wakes you in the night. °· You have abdominal pain that is worsened or improved by eating food. °· You have abdominal pain that is worsened with eating fatty foods. °· You have a fever. °SEEK IMMEDIATE MEDICAL CARE IF: °· Your pain does not go away within 2 hours. °· You keep throwing up (vomiting). °· Your pain is felt only in portions of the abdomen, such as the right side or the left lower portion of the abdomen. °· You pass bloody or black tarry stools. °MAKE SURE YOU: °· Understand these instructions. °· Will watch your condition. °· Will get help right away if you are not doing well or get worse. °  °This information is not intended to replace advice given to you by your health care provider. Make sure you discuss  any questions you have with your health care provider. °  °Document Released: 01/06/2005 Document Revised: 12/18/2014 Document Reviewed: 12/06/2012 °Elsevier Interactive Patient Education ©2016 Elsevier Inc. ° °Diarrhea °Diarrhea is frequent loose and watery bowel movements. It can cause you to feel weak and dehydrated. Dehydration can cause you to become tired and thirsty, have a dry mouth, and have decreased urination that often is dark yellow. Diarrhea is a sign of another problem, most often an infection that will not last long. In most cases, diarrhea typically lasts 2-3 days. However, it can last longer if it is a sign of something more serious. It is important to treat your diarrhea as directed by your caregiver to lessen or prevent future episodes of diarrhea. °CAUSES  °Some common causes include: °· Gastrointestinal infections caused by viruses, bacteria, or parasites. °· Food poisoning or food allergies. °· Certain medicines, such as antibiotics, chemotherapy, and laxatives. °· Artificial sweeteners and fructose. °· Digestive disorders. °HOME CARE INSTRUCTIONS °· Ensure adequate fluid intake (hydration): Have 1 cup (8 oz) of fluid for each diarrhea episode. Avoid fluids that contain simple sugars or sports drinks, fruit juices, whole milk products, and sodas. Your urine should be clear or pale yellow if you are drinking enough fluids. Hydrate with an oral rehydration solution that you can purchase at pharmacies, retail stores, and online. You can prepare an oral rehydration solution at home by mixing the following ingredients together: °¨  - tsp table salt. °¨ ¾ tsp baking soda. °¨    tsp salt substitute containing potassium chloride.  1  tablespoons sugar.  1 L (34 oz) of water.  Certain foods and beverages may increase the speed at which food moves through the gastrointestinal (GI) tract. These foods and beverages should be avoided and include:  Caffeinated and alcoholic beverages.  High-fiber  foods, such as raw fruits and vegetables, nuts, seeds, and whole grain breads and cereals.  Foods and beverages sweetened with sugar alcohols, such as xylitol, sorbitol, and mannitol.  Some foods may be well tolerated and may help thicken stool including:  Starchy foods, such as rice, toast, pasta, low-sugar cereal, oatmeal, grits, baked potatoes, crackers, and bagels.  Bananas.  Applesauce.  Add probiotic-rich foods to help increase healthy bacteria in the GI tract, such as yogurt and fermented milk products.  Wash your hands well after each diarrhea episode.  Only take over-the-counter or prescription medicines as directed by your caregiver.  Take a warm bath to relieve any burning or pain from frequent diarrhea episodes. SEEK IMMEDIATE MEDICAL CARE IF:   You are unable to keep fluids down.  You have persistent vomiting.  You have blood in your stool, or your stools are black and tarry.  You do not urinate in 6-8 hours, or there is only a small amount of very dark urine.  You have abdominal pain that increases or localizes.  You have weakness, dizziness, confusion, or light-headedness.  You have a severe headache.  Your diarrhea gets worse or does not get better.  You have a fever or persistent symptoms for more than 2-3 days.  You have a fever and your symptoms suddenly get worse. MAKE SURE YOU:   Understand these instructions.  Will watch your condition.  Will get help right away if you are not doing well or get worse.   This information is not intended to replace advice given to you by your health care provider. Make sure you discuss any questions you have with your health care provider.   Document Released: 03/19/2002 Document Revised: 04/19/2014 Document Reviewed: 12/05/2011 Elsevier Interactive Patient Education 2016 [place] Choices to Help Relieve Diarrhea, Adult When you have diarrhea, the foods you eat and your eating habits are very  important. Choosing the right foods and drinks can help relieve diarrhea. Also, because diarrhea can last up to 7 days, you need to replace lost fluids and electrolytes (such as sodium, potassium, and chloride) in order to help prevent dehydration.  WHAT GENERAL GUIDELINES DO I NEED TO FOLLOW?  Slowly drink 1 cup (8 oz) of fluid for each episode of diarrhea. If you are getting enough fluid, your urine will be clear or pale yellow.  Eat starchy foods. Some good choices include white rice, white toast, pasta, low-fiber cereal, baked potatoes (without the skin), saltine crackers, and bagels.  Avoid large servings of any cooked vegetables.  Limit fruit to two servings per day. A serving is  cup or 1 small piece.  Choose foods with less than 2 g of fiber per serving.  Limit fats to less than 8 tsp (38 g) per day.  Avoid fried foods.  Eat foods that have probiotics in them. Probiotics can be found in certain dairy products.  Avoid foods and beverages that may increase the speed at which food moves through the stomach and intestines (gastrointestinal tract). Things to avoid include:  High-fiber foods, such as dried fruit, raw fruits and vegetables, nuts, seeds, and whole grain foods.  Spicy foods and high-fat foods.  Foods and beverages sweetened with high-fructose corn syrup, honey, or sugar alcohols such as xylitol, sorbitol, and mannitol. WHAT FOODS ARE RECOMMENDED? Grains White rice. White, [place], or pita breads (fresh or toasted), including plain rolls, buns, or bagels. White pasta. Saltine, soda, or graham crackers. Pretzels. Low-fiber cereal. Cooked cereals made with water (such as cornmeal, farina, or cream cereals). Plain muffins. Matzo. Melba toast. Zwieback.  Vegetables Potatoes (without the skin). Strained tomato and vegetable juices. Most well-cooked and canned vegetables without seeds. Tender lettuce. Fruits Cooked or canned applesauce, apricots, cherries, fruit cocktail,  grapefruit, peaches, pears, or plums. Fresh bananas, apples without skin, cherries, grapes, cantaloupe, grapefruit, peaches, oranges, or plums.  Meat and Other Protein Products Baked or boiled chicken. Eggs. Tofu. Fish. Seafood. Smooth peanut butter. Ground or well-cooked tender beef, ham, veal, lamb, pork, or poultry.  Dairy Plain yogurt, kefir, and unsweetened liquid yogurt. Lactose-free milk, buttermilk, or soy milk. Plain hard cheese. Beverages Sport drinks. Clear broths. Diluted fruit juices (except prune). Regular, caffeine-free sodas such as ginger ale. Water. Decaffeinated teas. Oral rehydration solutions. Sugar-free beverages not sweetened with sugar alcohols. Other Bouillon, broth, or soups made from recommended foods.  The items listed above may not be a complete list of recommended foods or beverages. Contact your dietitian for more options. WHAT FOODS ARE NOT RECOMMENDED? Grains Whole grain, whole wheat, bran, or rye breads, rolls, pastas, crackers, and cereals. Wild or brown rice. Cereals that contain more than 2 g of fiber per serving. Corn tortillas or taco shells. Cooked or dry oatmeal. Granola. Popcorn. Vegetables Raw vegetables. Cabbage, broccoli, Brussels sprouts, artichokes, baked beans, beet greens, corn, kale, legumes, peas, sweet potatoes, and yams. Potato skins. Cooked spinach and cabbage. Fruits Dried fruit, including raisins and dates. Raw fruits. Stewed or dried prunes. Fresh apples with skin, apricots, mangoes, pears, raspberries, and strawberries.  Meat and Other Protein Products Chunky peanut butter. Nuts and seeds. Beans and lentils. Berniece Salines.  Dairy High-fat cheeses. Milk, chocolate milk, and beverages made with milk, such as milk shakes. Cream. Ice cream. Sweets and Desserts Sweet rolls, doughnuts, and sweet breads. Pancakes and waffles. Fats and Oils Butter. Cream sauces. Margarine. Salad oils. Plain salad dressings. Olives. Avocados.  Beverages Caffeinated  beverages (such as coffee, tea, soda, or energy drinks). Alcoholic beverages. Fruit juices with pulp. Prune juice. Soft drinks sweetened with high-fructose corn syrup or sugar alcohols. Other Coconut. Hot sauce. Chili powder. Mayonnaise. Gravy. Cream-based or milk-based soups.  The items listed above may not be a complete list of foods and beverages to avoid. Contact your dietitian for more information. WHAT SHOULD I DO IF I BECOME DEHYDRATED? Diarrhea can sometimes lead to dehydration. Signs of dehydration include dark urine and dry mouth and skin. If you think you are dehydrated, you should rehydrate with an oral rehydration solution. These solutions can be purchased at pharmacies, retail stores, or online.  Drink -1 cup (120-240 mL) of oral rehydration solution each time you have an episode of diarrhea. If drinking this amount makes your diarrhea worse, try drinking smaller amounts more often. For example, drink 1-3 tsp (5-15 mL) every 5-10 minutes.  A general rule for staying hydrated is to drink 1-2 L of fluid per day. Talk to your health care provider about the specific amount you should be drinking each day. Drink enough fluids to keep your urine clear or pale yellow.   This information is not intended to replace advice given to you by your health care provider. Make sure you discuss  any questions you have with your health care provider.   Document Released: 06/19/2003 Document Revised: 04/19/2014 Document Reviewed: 02/19/2013 Elsevier Interactive Patient Education [employer].

## 2016-01-29 ENCOUNTER — Emergency Department
Admission: EM | Admit: 2016-01-29 | Discharge: 2016-01-29 | Disposition: A | Discharge status: Home / Self Care | Payer: BLUE CROSS/BLUE SHIELD | Attending: Emergency Medicine | Admitting: Emergency Medicine

## 2016-01-29 ENCOUNTER — Encounter: Payer: Self-pay | Admitting: *Deleted

## 2016-01-29 DIAGNOSIS — Y990 Civilian activity done for income or pay: Secondary | ICD-10-CM | POA: Diagnosis not present

## 2016-01-29 DIAGNOSIS — W228XXA Striking against or struck by other objects, initial encounter: Secondary | ICD-10-CM | POA: Diagnosis not present

## 2016-01-29 DIAGNOSIS — Z79899 Other long term (current) drug therapy: Secondary | ICD-10-CM | POA: Diagnosis not present

## 2016-01-29 DIAGNOSIS — Y9389 Activity, other specified: Secondary | ICD-10-CM | POA: Diagnosis not present

## 2016-01-29 DIAGNOSIS — J45909 Unspecified asthma, uncomplicated: Secondary | ICD-10-CM | POA: Diagnosis not present

## 2016-01-29 DIAGNOSIS — S0101XA Laceration without foreign body of scalp, initial encounter: Secondary | ICD-10-CM | POA: Diagnosis not present

## 2016-01-29 DIAGNOSIS — Y929 Unspecified place or not applicable: Secondary | ICD-10-CM | POA: Insufficient documentation

## 2016-01-29 DIAGNOSIS — Z23 Encounter for immunization: Secondary | ICD-10-CM | POA: Diagnosis not present

## 2016-01-29 DIAGNOSIS — S0990XA Unspecified injury of head, initial encounter: Secondary | ICD-10-CM | POA: Diagnosis present

## 2016-01-29 MED ORDER — TETANUS-DIPHTH-ACELL PERTUSSIS 5-2.5-18.5 LF-MCG/0.5 IM SUSP
0.5000 mL | Freq: Once | INTRAMUSCULAR | Status: AC
  Administered 2016-01-29: 0.5 mL via INTRAMUSCULAR
  Filled 2016-01-29: qty 0.5

## 2016-01-29 MED ORDER — ACETAMINOPHEN 325 MG PO TABS
650.0000 mg | ORAL_TABLET | Freq: Once | ORAL | Status: AC
  Administered 2016-01-29: 650 mg via ORAL
  Filled 2016-01-29: qty 2

## 2016-01-29 NOTE — ED Notes (Addendum)
Pt reports wants to file w/c, works at [employer], states she will need UDS, per tech, being addressed by triage tech

## 2016-01-29 NOTE — ED Notes (Signed)
Workman's Comp completed. Specimen walked to lab and paperwork on chart.

## 2016-01-29 NOTE — ED Provider Notes (Signed)
[facility] Emergency Department Provider Note  ____________________________________________  Time seen: Approximately 9:37 PM  I have reviewed the triage vital signs and the nursing notes.   HISTORY  Chief Complaint Head Injury    HPI Jacqueline Sanders is a 50 y.o. female who presents emergency department complaining of laceration to the left side of scalp. Patient states that she was at work when she bent over as she stood up she caught the edge of compare belt. Patient states that initially it bled freely but she was able control bleeding with direct pressure. No loss of consciousness. No visual changes, no neck pain. Patient is unsure of last tetanus shot. No other injury or complaint. No medications prior to arrival.   Past Medical History:  Diagnosis Date  . Asthma     There are no active problems to display for this patient.   Past Surgical History:  Procedure Laterality Date  . ABDOMINAL HYSTERECTOMY      Prior to Admission medications   Medication Sig Start Date End Date Taking? Authorizing Provider  loperamide (IMODIUM A-D) 2 MG tablet Take 1 tablet (2 mg total) by mouth 4 (four) times daily as needed for diarrhea or loose stools. 02/14/15   Lavonia Drafts, MD  naproxen (EC NAPROSYN) 500 MG EC tablet Take 1 tablet (500 mg total) by mouth 2 (two) times daily with a meal. 08/28/14   Jenise V Bacon Menshew, PA-C    Allergies Sulfa antibiotics  No family history on file.  Social History Social History  Substance Use Topics  . Smoking status: Never Smoker  . Smokeless tobacco: Never Used  . Alcohol use No     Review of Systems  Constitutional: No fever/chills Eyes: No visual changes.  Cardiovascular: no chest pain. Respiratory: no cough. No SOB. Musculoskeletal: Negative for musculoskeletal pain. Skin: Positive for laceration to the left side of scalp Neurological: Negative for headaches, focal weakness or numbness. 10-point ROS  otherwise negative.  ____________________________________________   PHYSICAL EXAM:  VITAL SIGNS: ED Triage Vitals [01/29/16 2045]  Enc Vitals Group     BP (!) 181/96     Pulse Rate 78     Resp 18     Temp 98 F (36.7 C)     Temp Source Oral     SpO2 100 %     Weight 265 lb (120.2 kg)     Height 5\' 8"  (1.727 m)     Head Circumference      Peak Flow      Pain Score 8     Pain Loc      Pain Edu?      Excl. in [place]?      Constitutional: Alert and oriented. Well appearing and in no acute distress. Eyes: Conjunctivae are normal. PERRL. EOMI. Head: Superficial laceration is noted to the left parietal region of the scalp. There is approximately 1 cm long. Superficial in nature. No bleeding. No foreign body. Area is nontender to palpation. No palpable abnormality. No crepitus. No battle signs. No raccoon eyes. No serosanguineous fluid drainage from the ears or nares. ENT:      Ears:       Nose: No congestion/rhinnorhea.      Mouth/Throat: Mucous membranes are moist.  Neck: No stridor.  No cervical spine tenderness to palpation.  Cardiovascular: Normal rate, regular rhythm. Normal S1 and S2.  Good peripheral circulation. Respiratory: Normal respiratory effort without tachypnea or retractions. Lungs CTAB. Good air entry to the bases  with no decreased or absent breath sounds. Musculoskeletal: Full range of motion to all extremities. No gross deformities appreciated. Neurologic:  Normal speech and language. No gross focal neurologic deficits are appreciated.  Skin:  Skin is warm, dry and intact. No rash noted. Psychiatric: Mood and affect are normal. Speech and behavior are normal. Patient exhibits appropriate insight and judgement.   ____________________________________________   LABS (all labs ordered are listed, but only abnormal results are displayed)  Labs Reviewed - No data to  display ____________________________________________  EKG   ____________________________________________  RADIOLOGY   No results found.  ____________________________________________    PROCEDURES  Procedure(s) performed:    Procedures    Medications  Tdap (BOOSTRIX) injection 0.5 mL (not administered)  acetaminophen (TYLENOL) tablet 650 mg (not administered)     ____________________________________________   INITIAL IMPRESSION / ASSESSMENT AND PLAN / ED COURSE  Pertinent labs & imaging results that were available during my care of the patient were reviewed by me and considered in my medical decision making (see chart for details).  Review of the [place] CSRS was performed in accordance of the [place] prior to dispensing any controlled drugs.  Clinical Course    Patient's diagnosis is consistent with Scalp laceration. This is superficial in nature and does not require closure. Patient is given tetanus shot in the emergency department. Patient to take Tylenol and Motrin at home as needed for pain relief.. She will follow-up with primary care as needed. Patient is given ED precautions to return to the ED for any worsening or new symptoms.     ____________________________________________  FINAL CLINICAL IMPRESSION(S) / ED DIAGNOSES  Final diagnoses:  Laceration of scalp, initial encounter      NEW MEDICATIONS STARTED DURING THIS VISIT:  New Prescriptions   No medications on file        This chart was dictated using voice recognition software/Dragon. Despite best efforts to proofread, errors can occur which can change the meaning. Any change was purely unintentional.    Darletta Moll, PA-C 01/29/16 2142    Eula Listen, MD 01/30/16 0002

## 2016-01-29 NOTE — ED Notes (Signed)
Abrasion noted to top of head, bleeding controlled at this time

## 2016-01-29 NOTE — ED Triage Notes (Signed)
Pt was at work, stood up and hit her head on conveyor belt. No LOC. Abrasion to the top of her head.

## 2016-07-01 DIAGNOSIS — J45909 Unspecified asthma, uncomplicated: Secondary | ICD-10-CM | POA: Insufficient documentation

## 2016-07-01 DIAGNOSIS — J0100 Acute maxillary sinusitis, unspecified: Secondary | ICD-10-CM | POA: Insufficient documentation

## 2016-07-01 DIAGNOSIS — J209 Acute bronchitis, unspecified: Secondary | ICD-10-CM | POA: Insufficient documentation

## 2016-07-01 NOTE — ED Triage Notes (Signed)
Pt in with co cough for few weeks with clear sputum, no fever. Pt does have congestion and sinus drainage.

## 2016-07-02 ENCOUNTER — Emergency Department
Admission: EM | Admit: 2016-07-02 | Discharge: 2016-07-02 | Disposition: A | Discharge status: Home / Self Care | Payer: BLUE CROSS/BLUE SHIELD | Attending: Emergency Medicine | Admitting: Emergency Medicine

## 2016-07-02 DIAGNOSIS — J209 Acute bronchitis, unspecified: Secondary | ICD-10-CM

## 2016-07-02 DIAGNOSIS — J0100 Acute maxillary sinusitis, unspecified: Secondary | ICD-10-CM

## 2016-07-02 MED ORDER — HYDROCOD POLST-CPM POLST ER 10-8 MG/5ML PO SUER: 5.0000 mL | Freq: Two times a day (BID) | ORAL | 0 refills | Status: DC | PRN

## 2016-07-02 MED ORDER — AZITHROMYCIN 500 MG PO TABS
500.0000 mg | ORAL_TABLET | Freq: Once | ORAL | Status: AC
  Administered 2016-07-02: 500 mg via ORAL
  Filled 2016-07-02: qty 1

## 2016-07-02 MED ORDER — AMOXICILLIN-POT CLAVULANATE 875-125 MG PO TABS: 1.0000 | ORAL_TABLET | Freq: Two times a day (BID) | ORAL | 0 refills | Status: AC

## 2016-07-02 MED ORDER — ALBUTEROL SULFATE HFA 108 (90 BASE) MCG/ACT IN AERS: 2.0000 | INHALATION_SPRAY | Freq: Four times a day (QID) | RESPIRATORY_TRACT | 1 refills | Status: AC | PRN

## 2016-07-02 MED ORDER — HYDROCOD POLST-CPM POLST ER 10-8 MG/5ML PO SUER
5.0000 mL | Freq: Once | ORAL | Status: AC
  Administered 2016-07-02: 5 mL via ORAL
  Filled 2016-07-02: qty 5

## 2016-07-02 NOTE — ED Provider Notes (Addendum)
[facility] Emergency Department Provider Note   First MD Initiated Contact with Patient 07/02/16 0106     (approximate)  I have reviewed the triage vital signs and the nursing notes.   HISTORY  Chief Complaint Cough    HPI Jacqueline Sanders is a 51 y.o. female presents with productive cough with clear sputum 2 weeks. Patient does admit to nasal congestion and "sinus drainage. Patient denies any fever no tobacco history. Patient denies any dyspnea at this time or chest pain.   Past Medical History:  Diagnosis Date  . Asthma     There are no active problems to display for this patient.   Past Surgical History:  Procedure Laterality Date  . ABDOMINAL HYSTERECTOMY      Prior to Admission medications   Medication Sig Start Date End Date Taking? Authorizing Provider  loperamide (IMODIUM A-D) 2 MG tablet Take 1 tablet (2 mg total) by mouth 4 (four) times daily as needed for diarrhea or loose stools. 02/14/15   Lavonia Drafts, MD  naproxen (EC NAPROSYN) 500 MG EC tablet Take 1 tablet (500 mg total) by mouth 2 (two) times daily with a meal. 08/28/14   Jenise V Bacon Menshew, PA-C    Allergies Sulfa antibiotics and Latex  No family history on file.  Social History Social History  Substance Use Topics  . Smoking status: Never Smoker  . Smokeless tobacco: Never Used  . Alcohol use No    Review of Systems Constitutional: No fever/chills Eyes: No visual changes. ENT: No sore throat.Positive for nasal congestion Cardiovascular: Denies chest pain. Respiratory: Denies shortness of breath.Positive for cough Gastrointestinal: No abdominal pain.  No nausea, no vomiting.  No diarrhea.  No constipation. Genitourinary: Negative for dysuria. Musculoskeletal: Negative for back pain. Skin: Negative for rash. Neurological: Negative for headaches, focal weakness or numbness.  10-point ROS otherwise  negative.  ____________________________________________   PHYSICAL EXAM:  VITAL SIGNS: ED Triage Vitals  Enc Vitals Group     BP 07/01/16 2218 (!) 150/85     Pulse Rate 07/01/16 2218 86     Resp 07/01/16 2218 18     Temp 07/01/16 2218 99.1 F (37.3 C)     Temp Source 07/01/16 2218 Oral     SpO2 07/01/16 2218 97 %     Weight 07/01/16 2218 270 lb (122.5 kg)     Height 07/01/16 2218 5\' 8"  (1.727 m)     Head Circumference --      Peak Flow --      Pain Score 07/02/16 0107 10     Pain Loc --      Pain Edu? --      Excl. in [place]? --    Constitutional: Alert and oriented. Well appearing and in no acute distress. Eyes: Conjunctivae are normal. PERRL. EOMI. Head: Atraumatic.Pain palpation bilateral maxillary sinus worse on left Ears:  Healthy appearing ear canals andLeft TM bulging with clear fluid noted Nose: No congestion/rhinnorhea. Mouth/Throat: Mucous membranes are moist. Oropharynx non-erythematous. Neck: No stridor.   Cardiovascular: Normal rate, regular rhythm. Good peripheral circulation. Grossly normal heart sounds. Respiratory: Normal respiratory effort.  No retractions. Lungs CTAB. Gastrointestinal: Soft and nontender. No distention.   Musculoskeletal: No lower extremity tenderness nor edema. No gross deformities of extremities. Neurologic:  Normal speech and language. No gross focal neurologic deficits are appreciated.  Skin:  Skin is warm, dry and intact. No rash noted.        Procedures   ____________________________________________  INITIAL IMPRESSION / ASSESSMENT AND PLAN / ED COURSE  Pertinent labs & imaging results that were available during my care of the patient were reviewed by me and considered in my medical decision making (see chart for details).  Patient given Tussionex and azithromycin emergency department will be prescribed albuterol inhaler and azithromycin for home      ____________________________________________  FINAL CLINICAL  IMPRESSION(S) / ED DIAGNOSES  Final diagnoses:  Acute non-recurrent maxillary sinusitis  Acute bronchitis, unspecified organism     MEDICATIONS GIVEN DURING THIS VISIT:  Medications  chlorpheniramine-HYDROcodone (TUSSIONEX) 10-8 MG/5ML suspension 5 mL (5 mLs Oral Given 07/02/16 0139)  azithromycin (ZITHROMAX) tablet 500 mg (500 mg Oral Given 07/02/16 0139)     NEW OUTPATIENT MEDICATIONS STARTED DURING THIS VISIT:  New Prescriptions   No medications on file    Modified Medications   No medications on file    Discontinued Medications   No medications on file     Note:  This document was prepared using Dragon voice recognition software and may include unintentional dictation errors.    Gregor Hams, MD 07/02/16 8099    Gregor Hams, MD 07/02/16 2242

## 2016-07-02 NOTE — ED Notes (Signed)
Pt ambulatory to treatment room, report cough and sob x few months.  States she ran out of inhaler yesterday and does not have nebulizer at home.  Pt reports hx of asthma.  Pt reports non productive cough, states soreness to throat and "lungs".

## 2016-10-14 ENCOUNTER — Emergency Department (HOSPITAL_COMMUNITY)
Admission: EM | Admit: 2016-10-14 | Discharge: 2016-10-15 | Disposition: A | Discharge status: Home / Self Care | Payer: Self-pay | Attending: Emergency Medicine | Admitting: Emergency Medicine

## 2016-10-14 ENCOUNTER — Encounter (HOSPITAL_COMMUNITY): Payer: Self-pay

## 2016-10-14 DIAGNOSIS — R109 Unspecified abdominal pain: Secondary | ICD-10-CM

## 2016-10-14 DIAGNOSIS — S0003XA Contusion of scalp, initial encounter: Secondary | ICD-10-CM

## 2016-10-14 DIAGNOSIS — Y929 Unspecified place or not applicable: Secondary | ICD-10-CM | POA: Insufficient documentation

## 2016-10-14 DIAGNOSIS — Z79899 Other long term (current) drug therapy: Secondary | ICD-10-CM | POA: Insufficient documentation

## 2016-10-14 DIAGNOSIS — Z91040 Latex allergy status: Secondary | ICD-10-CM | POA: Insufficient documentation

## 2016-10-14 DIAGNOSIS — R1084 Generalized abdominal pain: Secondary | ICD-10-CM | POA: Insufficient documentation

## 2016-10-14 DIAGNOSIS — Y999 Unspecified external cause status: Secondary | ICD-10-CM | POA: Insufficient documentation

## 2016-10-14 DIAGNOSIS — Y9389 Activity, other specified: Secondary | ICD-10-CM | POA: Insufficient documentation
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
## 2016-10-15 ENCOUNTER — Emergency Department (HOSPITAL_COMMUNITY): Payer: Self-pay

## 2016-10-15 LAB — I-STAT CHEM 8, ED
BUN: 17 mg/dL (ref 6–20)
Calcium, Ion: 1.15 mmol/L (ref 1.15–1.40)
Chloride: 104 mmol/L (ref 101–111)
Creatinine, Ser: 0.7 mg/dL (ref 0.44–1.00)
Glucose, Bld: 93 mg/dL (ref 65–99)
HCT: 35 % — ABNORMAL LOW (ref 36.0–46.0)
Hemoglobin: 11.9 g/dL — ABNORMAL LOW (ref 12.0–15.0)
Potassium: 3.7 mmol/L (ref 3.5–5.1)
Sodium: 141 mmol/L (ref 135–145)
TCO2: 26 mmol/L (ref 0–100)

## 2016-10-15 LAB — URINALYSIS, ROUTINE W REFLEX MICROSCOPIC
Bilirubin Urine: NEGATIVE
Glucose, UA: NEGATIVE mg/dL
Ketones, ur: NEGATIVE mg/dL
Leukocytes, UA: NEGATIVE
Nitrite: NEGATIVE
Protein, ur: NEGATIVE mg/dL
Specific Gravity, Urine: 1.023 (ref 1.005–1.030)
pH: 6 (ref 5.0–8.0)

## 2016-10-15 LAB — CBC WITH DIFFERENTIAL/PLATELET
Basophils Absolute: 0 10*3/uL (ref 0.0–0.1)
Basophils Relative: 0 %
Eosinophils Absolute: 0.2 10*3/uL (ref 0.0–0.7)
Eosinophils Relative: 2 %
HCT: 35.4 % — ABNORMAL LOW (ref 36.0–46.0)
Hemoglobin: 11.2 g/dL — ABNORMAL LOW (ref 12.0–15.0)
Lymphocytes Relative: 27 %
Lymphs Abs: 2.5 10*3/uL (ref 0.7–4.0)
MCH: 26.5 pg (ref 26.0–34.0)
MCHC: 31.6 g/dL (ref 30.0–36.0)
MCV: 83.7 fL (ref 78.0–100.0)
Monocytes Absolute: 0.7 10*3/uL (ref 0.1–1.0)
Monocytes Relative: 8 %
Neutro Abs: 6 10*3/uL (ref 1.7–7.7)
Neutrophils Relative %: 63 %
Platelets: 326 10*3/uL (ref 150–400)
RBC: 4.23 MIL/uL (ref 3.87–5.11)
RDW: 14 % (ref 11.5–15.5)
WBC: 9.4 10*3/uL (ref 4.0–10.5)

## 2016-10-15 MED ORDER — ACETAMINOPHEN 500 MG PO TABS
1000.0000 mg | ORAL_TABLET | Freq: Once | ORAL | Status: AC
  Administered 2016-10-15: 1000 mg via ORAL
  Filled 2016-10-15: qty 2

## 2016-10-15 MED ORDER — IOPAMIDOL (ISOVUE-300) INJECTION 61%
INTRAVENOUS | Status: AC
  Administered 2016-10-15: 100 mL
  Filled 2016-10-15: qty 100

## 2016-10-15 NOTE — ED Notes (Signed)
E-signature not available, pt verbalized understanding of DC instructions  

## 2016-10-15 NOTE — Discharge Instructions (Signed)
Your labs are normal.  He can safely take Tylenol for your discomfort.   An ice pack to the back of your head for additional comfort. Follow-up with your primary care physician as needed

## 2016-10-15 NOTE — ED Notes (Signed)
Pt ambulatory to restroom, steady gait.
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
## 2017-01-20 ENCOUNTER — Encounter (HOSPITAL_COMMUNITY): Payer: Self-pay | Admitting: Emergency Medicine

## 2017-01-20 ENCOUNTER — Ambulatory Visit (INDEPENDENT_AMBULATORY_CARE_PROVIDER_SITE_OTHER): Payer: Self-pay

## 2017-01-20 ENCOUNTER — Ambulatory Visit (HOSPITAL_COMMUNITY)
Admission: EM | Admit: 2017-01-20 | Discharge: 2017-01-20 | Disposition: A | Discharge status: Home / Self Care | Payer: BLUE CROSS/BLUE SHIELD | Attending: Family Medicine | Admitting: Family Medicine

## 2017-01-20 DIAGNOSIS — R310 Gross hematuria: Secondary | ICD-10-CM

## 2017-01-20 DIAGNOSIS — R10817 Generalized abdominal tenderness: Secondary | ICD-10-CM

## 2017-01-20 DIAGNOSIS — R3915 Urgency of urination: Secondary | ICD-10-CM | POA: Insufficient documentation

## 2017-01-20 DIAGNOSIS — Z79899 Other long term (current) drug therapy: Secondary | ICD-10-CM | POA: Insufficient documentation

## 2017-01-20 DIAGNOSIS — K5901 Slow transit constipation: Secondary | ICD-10-CM

## 2017-01-20 DIAGNOSIS — Z90710 Acquired absence of both cervix and uterus: Secondary | ICD-10-CM | POA: Insufficient documentation

## 2017-01-20 DIAGNOSIS — J45909 Unspecified asthma, uncomplicated: Secondary | ICD-10-CM | POA: Insufficient documentation

## 2017-01-20 LAB — POCT URINALYSIS DIP (DEVICE)
Bilirubin Urine: NEGATIVE
Glucose, UA: NEGATIVE mg/dL
Leukocytes, UA: NEGATIVE
Nitrite: NEGATIVE
Protein, ur: NEGATIVE mg/dL
Specific Gravity, Urine: >=1.03 (ref 1.005–1.030)
Urobilinogen, UA: 0.2 mg/dL (ref 0.0–1.0)
pH: 6 (ref 5.0–8.0)

## 2017-01-20 NOTE — Discharge Instructions (Signed)
Your urine is to be cultured to see if any infection grows out. It appears on your x-ray that you have a large amount of stool that needs to come out. Recommending using MiraLAX as directed. Most of the abdominal pain is due to abdominal gas and stool collection. For urinary symptoms take AZO standard. This will turn your urine reddish orange. If the culture is positive we will call you and treat over the telephone. You will need to follow-up with a primary care provider see this page below for community health and wellness. If not getting better may return but if getting worse and having new symptoms or problems, fever, chills, vomiting or increased abdominal pain go to emergency department.

## 2017-01-20 NOTE — ED Triage Notes (Signed)
Urgency, abdominal pain, no back pain.  Symptoms started Sunday, Monday symptoms worsened

## 2017-01-20 NOTE — ED Provider Notes (Signed)
[place]    CSN: [id number] Arrival date & time: 01/20/17  1537     History   Chief Complaint Chief Complaint  Patient presents with  . Recurrent UTI    HPI Jacqueline Sanders is a 51 y.o. female.   51 year old obese female complaining of urinary urgency and lower mid pelvic pain. The urgency causes her to have to  urinate frequently. Denies dysuria.   Additional history was brought about after examining the abdomen and that she was having generalized tenderness. She has significant amount of tenderness in the left lower quadrant of the abdomen, the epigastrium and generalized tenderness. Some guarding. Also has pain with defecation. Recently has been watery. She states 20 years ago she was told by her doctor that she might have irritable bowel syndrome but was never followed up  Hysterectomy a few years ago. Denies vaginal discharge.       Past Medical History:  Diagnosis Date  . Asthma     There are no active problems to display for this patient.   Past Surgical History:  Procedure Laterality Date  . ABDOMINAL HYSTERECTOMY      OB History    No data available       Home Medications    Prior to Admission medications   Medication Sig Start Date End Date Taking? Authorizing Provider  albuterol (PROVENTIL HFA;VENTOLIN HFA) 108 (90 Base) MCG/ACT inhaler Inhale 2 puffs into the lungs every 6 (six) hours as needed for wheezing or shortness of breath. 07/02/16   Gregor Hams, MD  chlorpheniramine-HYDROcodone [facility] PENNKINETIC ER) 10-8 MG/5ML SUER Take 5 mLs by mouth every 12 (twelve) hours as needed. 07/02/16   Gregor Hams, MD  loperamide (IMODIUM A-D) 2 MG tablet Take 1 tablet (2 mg total) by mouth 4 (four) times daily as needed for diarrhea or loose stools. 02/14/15   Lavonia Drafts, MD  naproxen (EC NAPROSYN) 500 MG EC tablet Take 1 tablet (500 mg total) by mouth 2 (two) times daily with a meal. 08/28/14   Menshew, Dannielle Karvonen, PA-C     Family History No family history on file.  Social History Social History  Substance Use Topics  . Smoking status: Never Smoker  . Smokeless tobacco: Never Used  . Alcohol use No     Allergies   Sulfa antibiotics and Latex   Review of Systems Review of Systems  Constitutional: Negative.   HENT: Negative.   Respiratory: Negative.   Gastrointestinal: Positive for abdominal pain, constipation and diarrhea. Negative for nausea and vomiting.  Genitourinary: Positive for pelvic pain and urgency. Negative for dysuria, vaginal bleeding and vaginal discharge.  Neurological: Negative.   All other systems reviewed and are negative.    Physical Exam Triage Vital Signs ED Triage Vitals  Enc Vitals Group     BP 01/20/17 1557 (!) 149/78     Pulse Rate 01/20/17 1557 73     Resp 01/20/17 1557 18     Temp 01/20/17 1557 98.1 F (36.7 C)     Temp src --      SpO2 01/20/17 1557 98 %     Weight --      Height --      Head Circumference --      Peak Flow --      Pain Score 01/20/17 1555 8     Pain Loc --      Pain Edu? --      Excl. in [place]? --  No data found.   Updated Vital Signs BP (!) 149/78 (BP Location: Right Arm) Comment (BP Location): large cuff  Pulse 73   Temp 98.1 F (36.7 C)   Resp 18   SpO2 98%   Visual Acuity Right Eye Distance:   Left Eye Distance:   Bilateral Distance:    Right Eye Near:   Left Eye Near:    Bilateral Near:     Physical Exam  Constitutional: She is oriented to person, place, and time. She appears well-developed and well-nourished. No distress.  Eyes: EOM are normal.  Neck: Normal range of motion. Neck supple.  Cardiovascular: Normal rate and regular rhythm.   Pulmonary/Chest: Effort normal and breath sounds normal. No respiratory distress.  Abdominal:  Abdomen soft with diffuse tenderness. Tenderness in the left lower quadrant and lesser in the right lower quadrant. Minimal tenderness over the mid suprapubic area. There is  tenderness in all other quadrants.  Musculoskeletal: She exhibits no edema.  Neurological: She is alert and oriented to person, place, and time. She exhibits normal muscle tone.  Skin: Skin is warm and dry.  Psychiatric: She has a normal mood and affect.  Nursing note and vitals reviewed.    UC Treatments / Results  Labs (all labs ordered are listed, but only abnormal results are displayed) Labs Reviewed  POCT URINALYSIS DIP (DEVICE) - Abnormal; Notable for the following:       Result Value   Ketones, ur TRACE (*)    Hgb urine dipstick MODERATE (*)    All other components within normal limits  URINE CULTURE    EKG  EKG Interpretation None       Radiology Dg Abd 1 View  Result Date: 01/20/2017 CLINICAL DATA:  Hematuria with lower belly pain. EXAM: ABDOMEN - 1 VIEW COMPARISON:  CT scan 10/15/2016 FINDINGS: Normal bowel gas pattern without findings to suggest small bowel obstruction. No unexpected abdominopelvic calcification. There are multiple phleboliths overlie the inferior anatomic pelvis. Degenerative changes noted lower lumbar spine. IMPRESSION: Negative. Electronically Signed   By: Misty Stanley M.D.   On: 01/20/2017 16:44    Procedures Procedures (including critical care time)  Medications Ordered in UC Medications - No data to display   Initial Impression / Assessment and Plan / UC Course  I have reviewed the triage vital signs and the nursing notes.  Pertinent labs & imaging results that were available during my care of the patient were reviewed by me and considered in my medical decision making (see chart for details).    Your urine is to be cultured to see if any infection grows out. It appears on your x-ray that you have a large amount of stool that needs to come out. Recommending using MiraLAX as directed. Most of the abdominal pain is due to abdominal gas and stool collection. For urinary symptoms take AZO standard. This will turn your urine reddish  orange. If the culture is positive we will call you and treat over the telephone. You will need to follow-up with a primary care provider see this page below for community health and wellness. If not getting better may return but if getting worse and having new symptoms or problems, fever, chills, vomiting or increased abdominal pain go to emergency department.    Final Clinical Impressions(s) / UC Diagnoses   Final diagnoses:  Urinary urgency  Gross hematuria  Generalized abdominal tenderness without rebound tenderness  Slow transit constipation    New Prescriptions New Prescriptions   No  medications on file     Controlled Substance Prescriptions [place] Controlled Substance Registry consulted? Not Applicable   Janne Napoleon, NP 01/20/17 1652

## 2017-01-21 LAB — URINE CULTURE: Culture: NO GROWTH

## 2017-02-27 IMAGING — CR DG CHEST 2V
1 series · 2 of 2 positions shown · non-contrast
Comparison: None.

CLINICAL DATA: Cough for 2 weeks, worsening.

EXAM:
CHEST  2 VIEW

[Series 1: dxr chest pa (or ap) and lateral · 0.14mm/px · 2 of 2 slices shown]
[im 1/2]
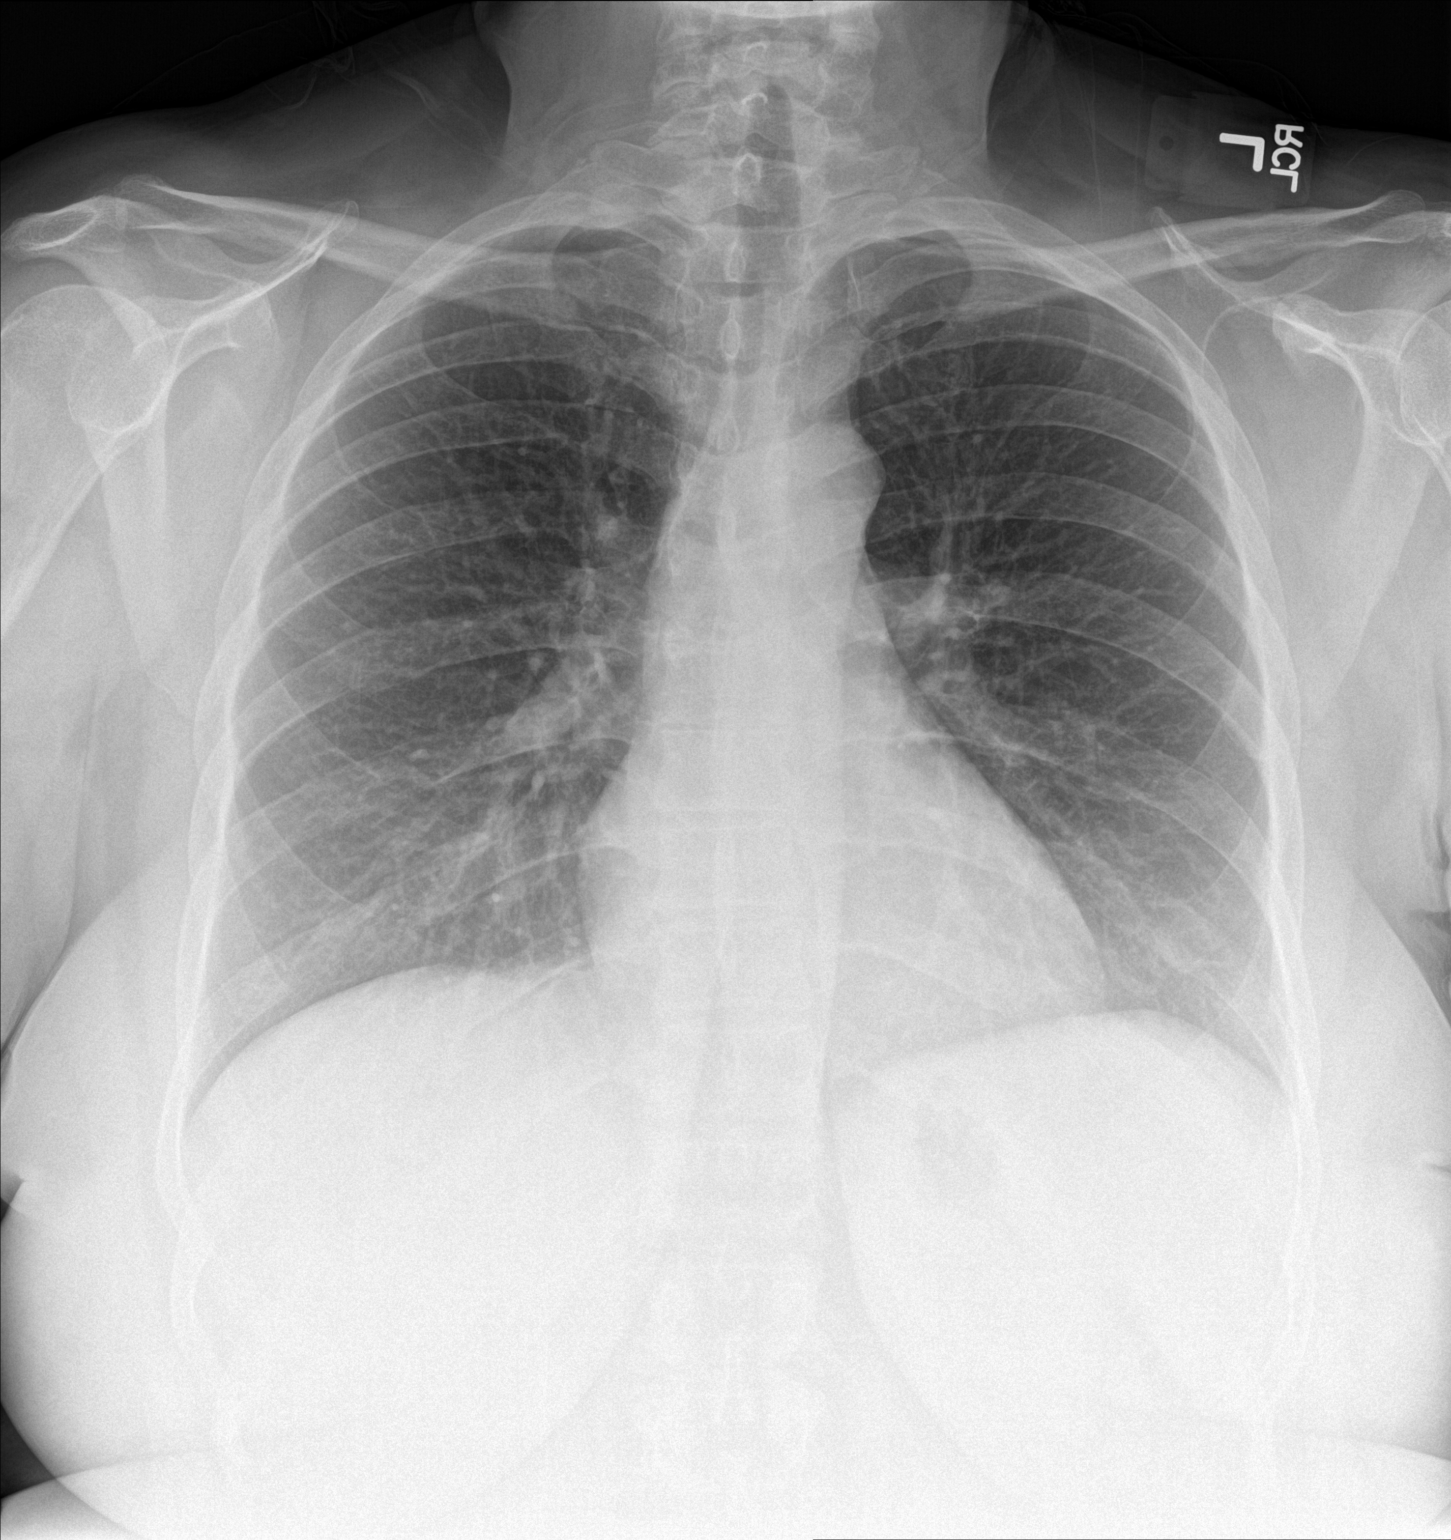
[im 2/2]
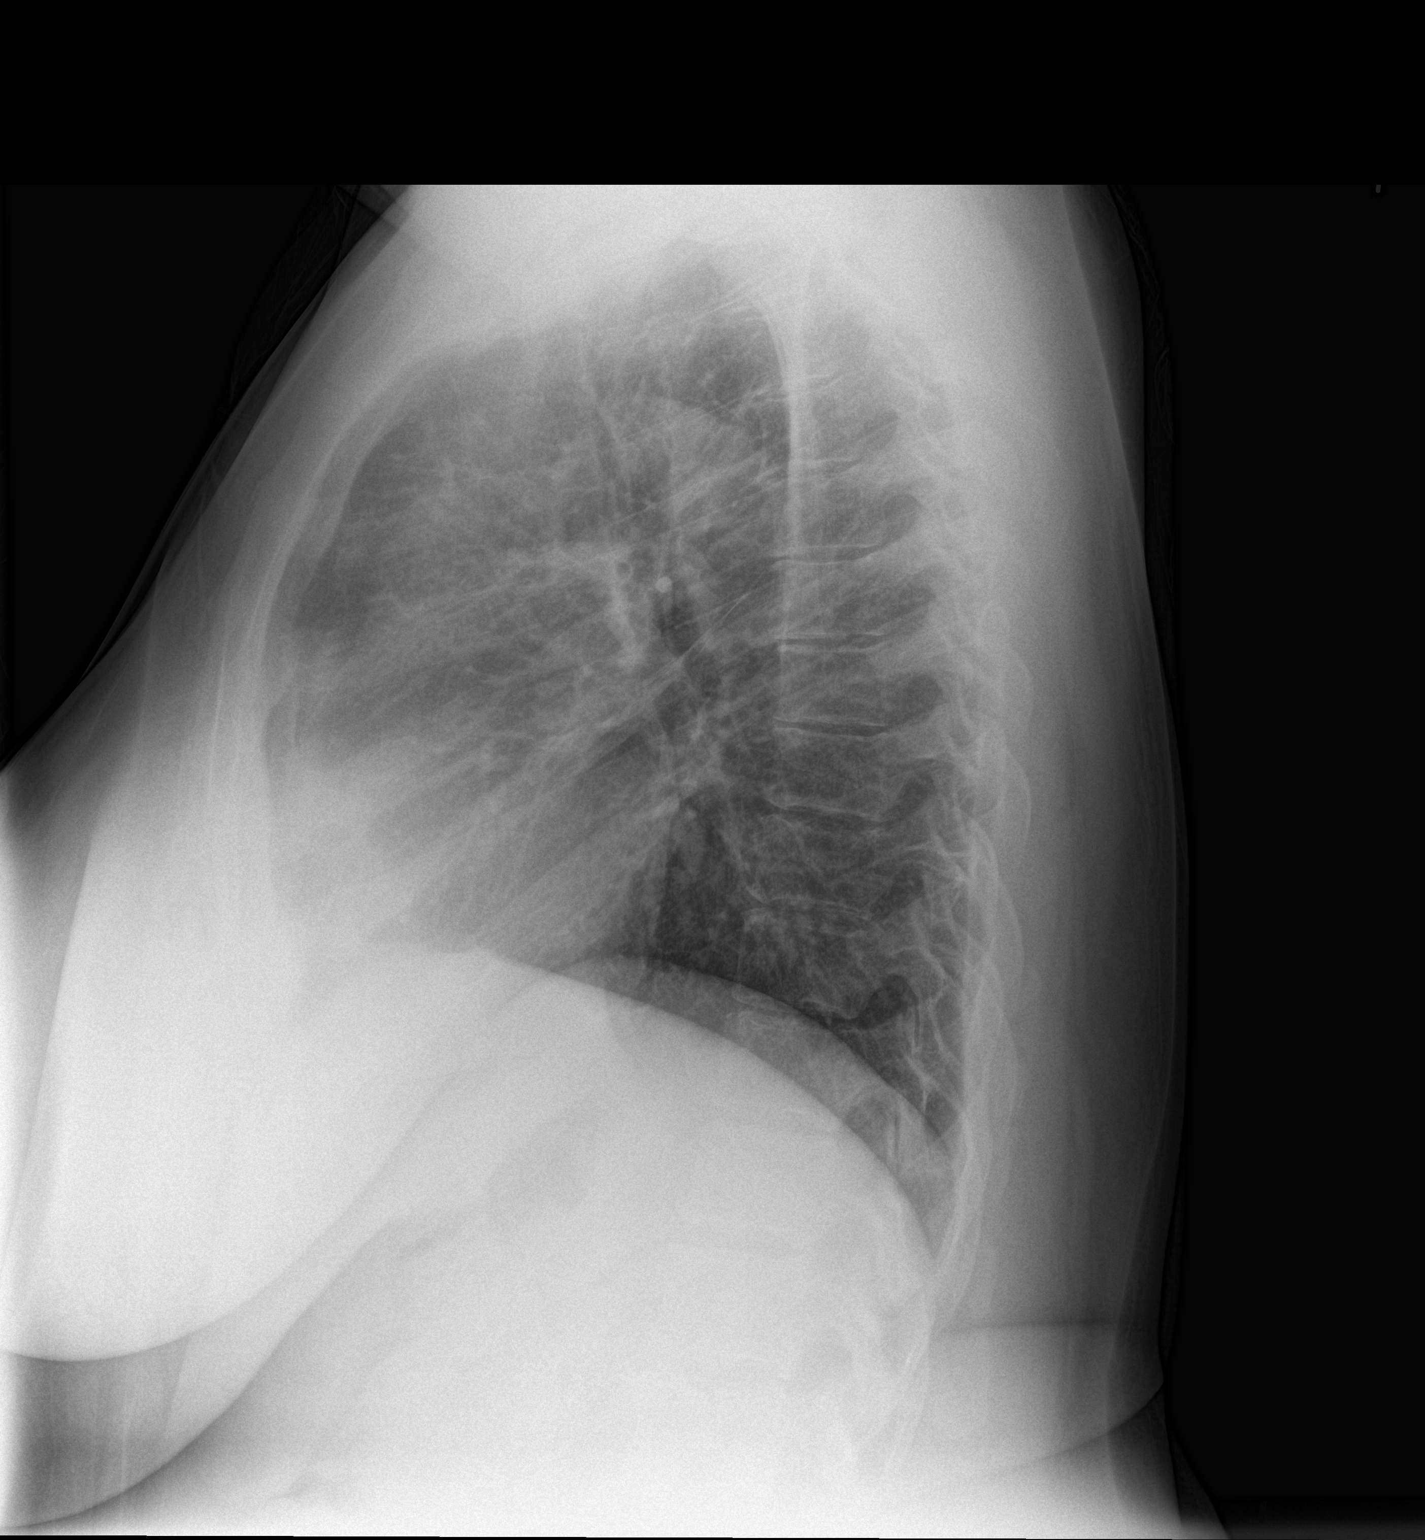

[2 of 2 positions shown; findings below may reference images not displayed]

FINDINGS: The lungs are clear. Heart size is normal. There is no pneumothorax
or pleural effusion.
IMPRESSION: No acute disease.

## 2017-05-25 ENCOUNTER — Encounter (HOSPITAL_COMMUNITY): Payer: Self-pay | Admitting: Emergency Medicine

## 2017-05-25 ENCOUNTER — Ambulatory Visit (HOSPITAL_COMMUNITY)
Admission: EM | Admit: 2017-05-25 | Discharge: 2017-05-25 | Disposition: A | Discharge status: Home / Self Care | Payer: BLUE CROSS/BLUE SHIELD | Attending: Family Medicine | Admitting: Family Medicine

## 2017-05-25 ENCOUNTER — Other Ambulatory Visit: Payer: Self-pay

## 2017-05-25 DIAGNOSIS — M545 Low back pain, unspecified: Secondary | ICD-10-CM

## 2017-05-25 DIAGNOSIS — K5900 Constipation, unspecified: Secondary | ICD-10-CM | POA: Diagnosis not present

## 2017-05-25 LAB — POCT URINALYSIS DIP (DEVICE)
Bilirubin Urine: NEGATIVE
Glucose, UA: NEGATIVE mg/dL
Ketones, ur: NEGATIVE mg/dL
Leukocytes, UA: NEGATIVE
Nitrite: NEGATIVE
Protein, ur: NEGATIVE mg/dL
Specific Gravity, Urine: 1.01 (ref 1.005–1.030)
Urobilinogen, UA: 0.2 mg/dL (ref 0.0–1.0)
pH: 6.5 (ref 5.0–8.0)

## 2017-05-25 LAB — POCT PREGNANCY, URINE: Preg Test, Ur: NEGATIVE

## 2017-05-25 MED ORDER — DOCUSATE SODIUM 100 MG PO CAPS: 100.0000 mg | ORAL_CAPSULE | Freq: Two times a day (BID) | ORAL | 0 refills | Status: AC

## 2017-05-25 MED ORDER — DOCUSATE SODIUM 100 MG PO CAPS: 100.0000 mg | ORAL_CAPSULE | Freq: Two times a day (BID) | ORAL | 0 refills | Status: DC

## 2017-05-25 NOTE — Discharge Instructions (Signed)
Please use Miralax for moderate to severe constipation. Take this once a day for the next 2-3 days. Please also start docusate stool softener, twice a day for at least 1 week. If stools become loose, cut down to once a day for another week. If stools remain loose, cut back to 1 pill every other day for a third week. You can stop docusate thereafter and resume as needed for constipation.  To help reduce constipation and promote bowel health: 1. Drink at least 64 ounces of water each day 2. Eat plenty of fiber (fruits, vegetables, whole grains, legumes) 3. Be physically active or exercise including walking, jogging, swimming, yoga, etc. 4. For active constipation use a stool softener (docusate) or an osmotic laxative (like Miralax) each day, or as needed.  Back pain appears to be more muscular versus related to the constipation.  Please take Tylenol to help with the pain.  Please return if pain not improving in the next 1-2 weeks, return sooner if pain worsening or you develop any other symptoms associated with this.

## 2017-05-25 NOTE — ED Provider Notes (Signed)
[place]    CSN: [id number] Arrival date & time: 05/25/17  1147     History   Chief Complaint Chief Complaint  Patient presents with  . Back Pain    HPI Jacqueline Sanders is a 52 y.o. female history of asthma and constipation presenting today with concern for right lower back pain and constipation.  She states that yesterday morning she woke up with right lower back pain down towards her.  She states that it worsens when she is standing and notices a lot when she is sleeping because she has a hard time getting comfortable.  She denies any lack of bowel or bladder control.  Denies saddle anesthesia.  She is also concerned about constipation, she has been taking MiraLAX as needed for constipation.  States yesterday when she had a bowel movement she had a stabbing sensation midline lumbar area.  She also had nausea for approximately 2-3 hours after but did resolve.  She took MiraLAX and had 2 AM, she feels like she is still constipated though.  Denies blood in stool.  Denies nausea or vomiting, eating and drinking without issue.  HPI  Past Medical History:  Diagnosis Date  . Asthma     There are no active problems to display for this patient.   Past Surgical History:  Procedure Laterality Date  . ABDOMINAL HYSTERECTOMY      OB History    No data available       Home Medications    Prior to Admission medications   Medication Sig Start Date End Date Taking? Authorizing Provider  albuterol (PROVENTIL HFA;VENTOLIN HFA) 108 (90 Base) MCG/ACT inhaler Inhale 2 puffs into the lungs every 6 (six) hours as needed for wheezing or shortness of breath. 07/02/16   Gregor Hams, MD  docusate sodium (COLACE) 100 MG capsule Take 1 capsule (100 mg total) by mouth every 12 (twelve) hours for 15 days. 05/25/17 06/09/17  Shanece Cochrane, Elesa Hacker, PA-C    Family History Family History  Problem Relation Age of Onset  . Hypertension Mother   . Diabetes Father     Social  History Social History   Tobacco Use  . Smoking status: Never Smoker  . Smokeless tobacco: Never Used  Substance Use Topics  . Alcohol use: No  . Drug use: No     Allergies   Sulfa antibiotics and Latex   Review of Systems Review of Systems  Constitutional: Negative for activity change, appetite change, fatigue and fever.  HENT: Negative for congestion and sore throat.   Respiratory: Negative for cough and shortness of breath.   Cardiovascular: Negative for chest pain.  Gastrointestinal: Positive for constipation. Negative for abdominal pain, blood in stool, nausea and vomiting.  Musculoskeletal: Positive for back pain and myalgias.  Skin: Negative for rash.  Neurological: Negative for dizziness, weakness, light-headedness and headaches.     Physical Exam Triage Vital Signs ED Triage Vitals  Enc Vitals Group     BP 05/25/17 1317 (!) 167/80     Pulse Rate 05/25/17 1317 (!) 59     Resp 05/25/17 1317 20     Temp 05/25/17 1317 98.1 F (36.7 C)     Temp Source 05/25/17 1317 Oral     SpO2 05/25/17 1317 100 %     Weight --      Height --      Head Circumference --      Peak Flow --      Pain Score 05/25/17  1313 6     Pain Loc --      Pain Edu? --      Excl. in [place]? --    No data found.  Updated Vital Signs BP (!) 167/80 (BP Location: Left Arm) Comment (BP Location): large cuff  Pulse (!) 59   Temp 98.1 F (36.7 C) (Oral)   Resp 20   SpO2 100%   Visual Acuity Right Eye Distance:   Left Eye Distance:   Bilateral Distance:    Right Eye Near:   Left Eye Near:    Bilateral Near:     Physical Exam  Constitutional: She appears well-developed and well-nourished. No distress.  HENT:  Head: Normocephalic and atraumatic.  Eyes: Conjunctivae are normal.  Neck: Neck supple.  Cardiovascular: Normal rate and regular rhythm.  No murmur heard. Pulmonary/Chest: Effort normal and breath sounds normal. No respiratory distress.  Abdominal: Soft. There is no tenderness.   Abdomen nondistended, soft, no guarding during exam.  Mild tenderness to lower abdomen.  Negative rebound, negative Rovsing's, negative McBurney's.  Musculoskeletal: She exhibits no edema.  Tenderness to palpation of right low gluteal area, palpation reproduces the pain she is having.  Patient does not have any lumbar tenderness or lumbar muscular tenderness.  Neurological: She is alert.  Skin: Skin is warm and dry.  Psychiatric: She has a normal mood and affect.  Nursing note and vitals reviewed.    UC Treatments / Results  Labs (all labs ordered are listed, but only abnormal results are displayed) Labs Reviewed  POCT URINALYSIS DIP (DEVICE) - Abnormal; Notable for the following components:      Result Value   Hgb urine dipstick MODERATE (*)    All other components within normal limits  POCT PREGNANCY, URINE    EKG  EKG Interpretation None       Radiology No results found.  Procedures Procedures (including critical care time)  Medications Ordered in UC Medications - No data to display   Initial Impression / Assessment and Plan / UC Course  I have reviewed the triage vital signs and the nursing notes.  Pertinent labs & imaging results that were available during my care of the patient were reviewed by me and considered in my medical decision making (see chart for details).     Back pain seems to be muscular, will treat with Tylenol.  Advised to continue to monitor for any changing symptoms or worsening of symptoms.  Patient denied abdominal pain during HPI, mild tenderness elicited on exam.  Abdomen does not appear acute or peritoneal.  Will advise patient to add an docusate to the MiraLAX.  Provided with recommendations for daily regimen to titrate bowels to be more regular.   Discussed strict return precautions-worsening abdominal or back pain, inability to pass stool with recommendations provided, lightheadedness, dizziness, development of nausea or vomiting,  fever. Patient verbalized understanding and is agreeable with plan.   Final Clinical Impressions(s) / UC Diagnoses   Final diagnoses:  Acute right-sided low back pain without sciatica  Constipation, unspecified constipation type    ED Discharge Orders        Ordered    docusate sodium (COLACE) 100 MG capsule  Every 12 hours,   Status:  Discontinued     05/25/17 1355    docusate sodium (COLACE) 100 MG capsule  Every 12 hours     05/25/17 1401       Controlled Substance Prescriptions [place] Controlled Substance Registry consulted? Not Applicable   Joneen Caraway,  Louise Victory C, PA-C 05/25/17 2130

## 2017-05-25 NOTE — ED Triage Notes (Signed)
Patient has back pain (left stabbing pain) that started yesterday morning.   Last bm was yesterday.  Prior to this is was Sunday .  Patient had sharp pain yesterday after bm.  Later in the evening took miralax and did have further bm.    Lower back pain continues across lower back.  Denies burning with urination

## 2017-11-22 ENCOUNTER — Emergency Department: Payer: BLUE CROSS/BLUE SHIELD

## 2017-11-22 ENCOUNTER — Encounter: Payer: Self-pay | Admitting: *Deleted

## 2017-11-22 ENCOUNTER — Other Ambulatory Visit: Payer: Self-pay

## 2017-11-22 ENCOUNTER — Emergency Department
Admission: EM | Admit: 2017-11-22 | Discharge: 2017-11-22 | Disposition: A | Discharge status: Home / Self Care | Payer: BLUE CROSS/BLUE SHIELD | Attending: Emergency Medicine | Admitting: Emergency Medicine

## 2017-11-22 DIAGNOSIS — K5900 Constipation, unspecified: Secondary | ICD-10-CM | POA: Insufficient documentation

## 2017-11-22 DIAGNOSIS — R1031 Right lower quadrant pain: Secondary | ICD-10-CM | POA: Insufficient documentation

## 2017-11-22 DIAGNOSIS — Z91040 Latex allergy status: Secondary | ICD-10-CM | POA: Insufficient documentation

## 2017-11-22 DIAGNOSIS — J45909 Unspecified asthma, uncomplicated: Secondary | ICD-10-CM | POA: Insufficient documentation

## 2017-11-22 LAB — URINALYSIS, COMPLETE (UACMP) WITH MICROSCOPIC
Bacteria, UA: NONE SEEN
Bilirubin Urine: NEGATIVE
Glucose, UA: NEGATIVE mg/dL
Ketones, ur: NEGATIVE mg/dL
Leukocytes, UA: NEGATIVE
Nitrite: NEGATIVE
Protein, ur: NEGATIVE mg/dL
Specific Gravity, Urine: 1.017 (ref 1.005–1.030)
pH: 7 (ref 5.0–8.0)

## 2017-11-22 LAB — COMPREHENSIVE METABOLIC PANEL
ALT: 14 U/L (ref 0–44)
AST: 16 U/L (ref 15–41)
Albumin: 3.8 g/dL (ref 3.5–5.0)
Alkaline Phosphatase: 76 U/L (ref 38–126)
Anion gap: 5 (ref 5–15)
BUN: 12 mg/dL (ref 6–20)
CO2: 28 mmol/L (ref 22–32)
Calcium: 8.7 mg/dL — ABNORMAL LOW (ref 8.9–10.3)
Chloride: 109 mmol/L (ref 98–111)
Creatinine, Ser: 0.64 mg/dL (ref 0.44–1.00)
GFR calc Af Amer: >60 mL/min (ref >60)
GFR calc non Af Amer: >60 mL/min (ref >60)
Glucose, Bld: 93 mg/dL (ref 70–99)
Potassium: 4.1 mmol/L (ref 3.5–5.1)
Sodium: 142 mmol/L (ref 135–145)
Total Bilirubin: 0.3 mg/dL (ref 0.3–1.2)
Total Protein: 7.2 g/dL (ref 6.5–8.1)

## 2017-11-22 LAB — CBC
HCT: 35.3 % (ref 35.0–47.0)
Hemoglobin: 11.7 g/dL — ABNORMAL LOW (ref 12.0–16.0)
MCH: 27.5 pg (ref 26.0–34.0)
MCHC: 33.2 g/dL (ref 32.0–36.0)
MCV: 82.6 fL (ref 80.0–100.0)
Platelets: 335 10*3/uL (ref 150–440)
RBC: 4.27 MIL/uL (ref 3.80–5.20)
RDW: 13.9 % (ref 11.5–14.5)
WBC: 7.9 10*3/uL (ref 3.6–11.0)

## 2017-11-22 LAB — LIPASE, BLOOD: Lipase: 21 U/L (ref 11–51)

## 2017-11-22 MED ORDER — IOHEXOL 300 MG/ML  SOLN
125.0000 mL | Freq: Once | INTRAMUSCULAR | Status: AC | PRN
  Administered 2017-11-22: 125 mL via INTRAVENOUS

## 2017-11-22 MED ORDER — KETOROLAC TROMETHAMINE 30 MG/ML IJ SOLN
30.0000 mg | Freq: Once | INTRAMUSCULAR | Status: AC
  Administered 2017-11-22: 30 mg via INTRAVENOUS
  Filled 2017-11-22: qty 1

## 2017-11-22 MED ORDER — POLYETHYLENE GLYCOL 3350 17 G PO PACK: 17.0000 g | PACK | Freq: Every day | ORAL | 0 refills | Status: AC

## 2017-11-22 NOTE — ED Notes (Signed)
Pt with abdominal pain due to not having a BM for the last couple of days. Pt states pain is like a pressure to the stomach and not been able to use the bathroom.

## 2017-11-22 NOTE — Discharge Instructions (Addendum)
Please follow up with the acute care clinic for further evaluation of your pain.

## 2017-11-22 NOTE — ED Provider Notes (Signed)
[facility] Emergency Department Provider Note   ____________________________________________   First MD Initiated Contact with Patient 11/22/17 0144     (approximate)  I have reviewed the triage vital signs and the nursing notes.   HISTORY  Chief Complaint Constipation    HPI Jacqueline Sanders is a 52 y.o. female who comes into the hospital today with some constipation and abdominal pain.  The patient states that she had not been to the bathroom since Saturday and whenever she tried she felt burning in her rectum.  She reports though that when she went to get her x-ray she had a very hard BM and now she is having pain in her low back and her right side.  She states that tonight at work her stomach was bloated and painful.  Somewhat offered her medicine but the patient did not take it.  She reports that her pain on the right is a 10 out of 10 in intensity.  She states that she has been told in the past that if she has back pains and constipation she should come into the hospital.  The patient did not take any medications at home.  She also has not been to her doctor recently as she moved from [place] to [place].  The patient is here for evaluation.   Past Medical History:  Diagnosis Date  . Asthma     There are no active problems to display for this patient.   Past Surgical History:  Procedure Laterality Date  . ABDOMINAL HYSTERECTOMY      Prior to Admission medications   Medication Sig Start Date End Date Taking? Authorizing Provider  albuterol (PROVENTIL HFA;VENTOLIN HFA) 108 (90 Base) MCG/ACT inhaler Inhale 2 puffs into the lungs every 6 (six) hours as needed for wheezing or shortness of breath. 07/02/16   Gregor Hams, MD  polyethylene glycol [facility]) packet Take 17 g by mouth daily. 11/22/17   Loney Hering, MD    Allergies Sulfa antibiotics and Latex  Family History  Problem Relation Age of Onset  . Hypertension Mother   .  Diabetes Father     Social History Social History   Tobacco Use  . Smoking status: Never Smoker  . Smokeless tobacco: Never Used  Substance Use Topics  . Alcohol use: No  . Drug use: No    Review of Systems  Constitutional: No fever/chills Eyes: No visual changes. ENT: No sore throat. Cardiovascular: Denies chest pain. Respiratory: Denies shortness of breath. Gastrointestinal:  abdominal pain and constipation No nausea, no vomiting.  No diarrhea.  No constipation. Genitourinary: Negative for dysuria. Musculoskeletal: Negative for back pain. Skin: Negative for rash. Neurological: Negative for headaches, focal weakness or numbness.   ____________________________________________   PHYSICAL EXAM:  VITAL SIGNS: ED Triage Vitals  Enc Vitals Group     BP 11/22/17 0029 (!) 161/88     Pulse Rate 11/22/17 0029 81     Resp 11/22/17 0029 20     Temp 11/22/17 0029 97.7 F (36.5 C)     Temp Source 11/22/17 0029 Oral     SpO2 11/22/17 0029 100 %     Weight 11/22/17 0030 275 lb (124.7 kg)     Height 11/22/17 0030 5\' 8"  (1.727 m)     Head Circumference --      Peak Flow --      Pain Score 11/22/17 0029 7     Pain Loc --      Pain Edu? --  Excl. in Advance? --     Constitutional: Alert and oriented. Well appearing and in no acute distress. Eyes: Conjunctivae are normal. PERRL. EOMI. Head: Atraumatic. Nose: No congestion/rhinnorhea. Mouth/Throat: Mucous membranes are moist.  Oropharynx non-erythematous. Cardiovascular: Normal rate, regular rhythm. Grossly normal heart sounds.  Good peripheral circulation. Respiratory: Normal respiratory effort.  No retractions. Lungs CTAB. Gastrointestinal: Soft with some RLQ tenderness to palpation. No distention. Positive bowel sounds Musculoskeletal: No lower extremity tenderness nor edema.   Neurologic:  Normal speech and language.  Skin:  Skin is warm, dry and intact. Psychiatric: Mood and affect are normal.    ____________________________________________   LABS (all labs ordered are listed, but only abnormal results are displayed)  Labs Reviewed  CBC - Abnormal; Notable for the following components:      Result Value   Hemoglobin 11.7 (*)    All other components within normal limits  COMPREHENSIVE METABOLIC PANEL - Abnormal; Notable for the following components:   Calcium 8.7 (*)    All other components within normal limits  URINALYSIS, COMPLETE (UACMP) WITH MICROSCOPIC - Abnormal; Notable for the following components:   Color, Urine YELLOW (*)    APPearance CLEAR (*)    Hgb urine dipstick SMALL (*)    All other components within normal limits  LIPASE, BLOOD   ____________________________________________  EKG  none ____________________________________________  RADIOLOGY  ED MD interpretation:  KUB: Nonobstructive bowel gas pattern with stool filled colon  CT abd and pelvis: No acute process demonstrated in the abdomen or pelvis, no evidence of bowel obstruction or inflammation, colonic diverticula without evidence of diverticulitis.  Nonobstructing stone in the left kidney.  Official radiology report(s): Dg Abdomen 1 View  Result Date: 11/22/2017 CLINICAL DATA:  Abdominal pain. No bowel movement for 2 days. EXAM: ABDOMEN - 1 VIEW COMPARISON:  01/20/2017 FINDINGS: Gas and stool throughout the colon. No small or large bowel distention. No radiopaque stones. Visualized bones and soft tissue contours appear intact. IMPRESSION: Nonobstructive bowel gas pattern with stool-filled colon. Electronically Signed   By: Lucienne Capers M.D.   On: 11/22/2017 01:38   Ct Abdomen Pelvis W Contrast  Result Date: 11/22/2017 CLINICAL DATA:  Constipation. No bowel movements for 2 days. Abdominal pain. EXAM: CT ABDOMEN AND PELVIS WITH CONTRAST TECHNIQUE: Multidetector CT imaging of the abdomen and pelvis was performed using the standard protocol following bolus administration of intravenous  contrast. CONTRAST:  156mL OMNIPAQUE IOHEXOL 300 MG/ML  SOLN COMPARISON:  10/15/2016 FINDINGS: Lower chest: Lung bases are clear. 1.8 cm diameter cystic structure inferior to the right pulmonary veins is unchanged since previous study. 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 Hepatobiliary: Scattered hepatic cysts. No solid focal lesions identified. Gallbladder and bile ducts are unremarkable. Pancreas: Unremarkable. No pancreatic ductal dilatation or surrounding inflammatory changes. Spleen: Normal in size without focal abnormality. Adrenals/Urinary Tract: No adrenal gland nodules. 2 mm stone in the midpole left kidney. No hydronephrosis or hydroureter. Bladder is unremarkable. Stomach/Bowel: Stomach, small bowel, and colon are not abnormally distended. Scattered stool throughout the colon. Colonic diverticula without evidence of diverticulitis. Appendix is normal. Scattered mesenteric lymph nodes are not pathologically enlarged, likely reactive. Vascular/Lymphatic: No significant vascular findings are present. No enlarged abdominal or pelvic lymph nodes. Reproductive: Status post hysterectomy. No adnexal masses. Other: No abdominal wall hernia or abnormality. No abdominopelvic ascites. Musculoskeletal: No acute or significant osseous findings. IMPRESSION: No acute process demonstrated in the abdomen or pelvis. No evidence of bowel obstruction or inflammation. Colonic diverticula without evidence of diverticulitis. Nonobstructing stone  in the left kidney. Electronically Signed   By: Lucienne Capers M.D.   On: 11/22/2017 03:38    ____________________________________________   PROCEDURES  Procedure(s) performed: None  Procedures  Critical Care performed: No  ____________________________________________   INITIAL IMPRESSION / ASSESSMENT AND PLAN / ED COURSE  As part of my medical decision making, I reviewed the following data within the electronic MEDICAL RECORD NUMBER Notes from prior ED visits and [address] Controlled  Substance Database   This is a 52 year old female who comes into the hospital today with a history of constipation and having some constipation and right lower quadrant abdominal pain.  The patient states that she was initially having abdominal bloating and back pain due to her constipation but now she is having some right-sided abdominal pain.  My concern is for colitis versus appendicitis.  I did initially send the patient for an x-ray looking for signs of a blockage or obstruction.  The patient does have stool-filled colon but I will send some blood work to include a CBC, CMP and a lipase.  I will also send the patient for CT scan.  She will be reassessed.  The patient CT scan is unremarkable.  I will give her a dose of Toradol 30 mg IV x1 and she will be discharged home.      ____________________________________________   FINAL CLINICAL IMPRESSION(S) / ED DIAGNOSES  Final diagnoses:  Right lower quadrant abdominal pain  Constipation, unspecified constipation type     ED Discharge Orders         Ordered    polyethylene glycol (MIRALAX) packet  Daily     11/22/17 0354           Note:  This document was prepared using Dragon voice recognition software and may include unintentional dictation errors.    Loney Hering, MD 11/22/17 [phone]

## 2017-11-22 NOTE — ED Notes (Signed)
Pt states that after she came back from x-ray she started having worse pain in the lower right quadrant. MD notified.

## 2017-11-22 NOTE — ED Triage Notes (Signed)
Pt reports no bm for 2 days.  Pt has abd pain.  Pt alert.

## 2018-04-25 ENCOUNTER — Other Ambulatory Visit: Payer: Self-pay | Admitting: Family Medicine

## 2018-04-25 DIAGNOSIS — Z1231 Encounter for screening mammogram for malignant neoplasm of breast: Secondary | ICD-10-CM

## 2018-05-01 ENCOUNTER — Encounter: Payer: Self-pay | Admitting: *Deleted

## 2018-05-05 ENCOUNTER — Encounter: Payer: Self-pay | Admitting: Gastroenterology

## 2018-05-18 ENCOUNTER — Ambulatory Visit
Admission: RE | Admit: 2018-05-18 | Discharge: 2018-05-18 | Disposition: A | Discharge status: Home / Self Care | Payer: BLUE CROSS/BLUE SHIELD | Source: Ambulatory Visit | Attending: Family Medicine | Admitting: Family Medicine

## 2018-05-18 DIAGNOSIS — Z1231 Encounter for screening mammogram for malignant neoplasm of breast: Secondary | ICD-10-CM | POA: Diagnosis not present

## 2018-06-07 ENCOUNTER — Encounter: Payer: Self-pay | Admitting: Gastroenterology

## 2018-06-07 ENCOUNTER — Other Ambulatory Visit: Payer: Self-pay

## 2018-06-07 ENCOUNTER — Ambulatory Visit (INDEPENDENT_AMBULATORY_CARE_PROVIDER_SITE_OTHER): Payer: BLUE CROSS/BLUE SHIELD | Admitting: Gastroenterology

## 2018-06-07 ENCOUNTER — Telehealth: Payer: Self-pay | Admitting: Gastroenterology

## 2018-06-07 VITALS — BP 167/98 | HR 64 | Ht 68.0 in | Wt 259.0 lb

## 2018-06-07 DIAGNOSIS — K581 Irritable bowel syndrome with constipation: Secondary | ICD-10-CM

## 2018-06-07 MED ORDER — DICYCLOMINE HCL 10 MG PO CAPS: 10.0000 mg | ORAL_CAPSULE | Freq: Three times a day (TID) | ORAL | 0 refills | Status: AC

## 2018-06-07 MED ORDER — DICYCLOMINE HCL 10 MG PO CAPS: 10.0000 mg | ORAL_CAPSULE | Freq: Three times a day (TID) | ORAL | 0 refills | Status: DC

## 2018-06-07 NOTE — Telephone Encounter (Signed)
Patient called stating she was given samples of Amitiza & needs to know how to take them. A new script needs to be sent to the [place] (dicyclomine).

## 2018-06-07 NOTE — Telephone Encounter (Signed)
Spoke with pt and reminded her of Amitiza directions. I also explained to pt that we would resend her prescription for dicyclomine to [facility].

## 2018-06-07 NOTE — Progress Notes (Signed)
Jonathon Bellows MD, MRCP(U.K) [address]  [place]  [place], [place] [address]  Main: [phone]  Fax: [phone]   Gastroenterology Consultation  Referring Provider:     Ranae Plumber, [place] Primary Care Physician:  Ranae Plumber, [place] Primary Gastroenterologist:  Dr. Jonathon Bellows  Reason for Consultation:     Abdominal cramps         HPI:   Jacqueline Sanders is a 53 y.o. y/o female referred for consultation & management  by Dr. Ranae Plumber, [place].    Been at the ER in 11/2017 for RLQ pain.CT abdomen showed colonic diverticulosis . CMP normal.   She says that she is here to see me for abdominal pain. In 01/2017 thought she had an UTI, was seen in [place]- had some lab work and was told it was normal, says she had some tenderness around her belly button and was told she was constipated, she was advised to see a specialist , she then says she was started on Linzess 145 mcg by her PCP and caused cramping , diarrhea. Was decreased to 72 mcg and caused more cramping.   Presently she says all life has had issues with abdominal cramping diarrhea.   Has more often has constipation. Has a bowel movement daily if she takes a medication such as linzess, miralax, stool softeners says it causes her to burn when she has a bowel movement . Says it makes her go "too much "Denies any abdominal pain. Says at times she has abdominal cramping. The bowel movements help with the cramping .   Lost 19 lbs intentionally - increased her fiber intake . Recently had a colonoscopy at [facility] 1 year back.    Past Medical History:  Diagnosis Date  . Asthma     Past Surgical History:  Procedure Laterality Date  . ABDOMINAL HYSTERECTOMY      Prior to Admission medications   Medication Sig Start Date End Date Taking? Authorizing Provider  albuterol (PROVENTIL HFA;VENTOLIN HFA) 108 (90 Base) MCG/ACT inhaler Inhale 2 puffs into the lungs every 6 (six) hours as needed for wheezing or shortness of  breath. 07/02/16  Yes Gregor Hams, MD  FLOVENT [facility] 110 MCG/ACT inhaler  04/10/18  Yes [provider]  fluticasone (FLONASE) 50 MCG/ACT nasal spray  04/10/18  Yes [provider]  polyethylene glycol (MIRALAX) packet Take 17 g by mouth daily. 11/22/17  Yes Loney Hering, MD  solifenacin (VESICARE) 10 MG tablet Take 10 mg by mouth daily. 05/16/18  Yes [provider]    Family History  Problem Relation Age of Onset  . Hypertension Mother   . Diabetes Father   . Breast cancer Sister 35     Social History   Tobacco Use  . Smoking status: Never Smoker  . Smokeless tobacco: Never Used  Substance Use Topics  . Alcohol use: No  . Drug use: No    Allergies as of 06/07/2018 - Review Complete 06/07/2018  Allergen Reaction Noted  . Sulfa antibiotics Swelling 08/28/2014  . Latex Rash 07/01/2016    Review of Systems:    All systems reviewed and negative except where noted in HPI.   Physical Exam:  BP (!) 167/98   Pulse 64   Ht 5\' 8"  (1.727 m)   Wt 259 lb (117.5 kg)   BMI 39.38 kg/m  No LMP recorded. Patient has had a hysterectomy. Psych:  Alert and cooperative. Normal mood and affect. General:   Alert,  Well-developed,  well-nourished, pleasant and cooperative in NAD Head:  Normocephalic and atraumatic. Eyes:  Sclera clear, no icterus.   Conjunctiva pink. Ears:  Normal auditory acuity. Nose:  No deformity, discharge, or lesions. Mouth:  No deformity or lesions,oropharynx pink & moist. Neck:  Supple; no masses or thyromegaly. Lungs:  Respirations even and unlabored.  Clear throughout to auscultation.   No wheezes, crackles, or rhonchi. No acute distress. Heart:  Regular rate and rhythm; no murmurs, clicks, rubs, or gallops. Abdomen:  Normal bowel sounds.  No bruits.  Soft, non-tender and non-distended without masses, hepatosplenomegaly or hernias noted.  No guarding or rebound tenderness.    Neurologic:  Alert and oriented x3;  grossly normal  neurologically. Skin:  Intact without significant lesions or rashes. No jaundice. Lymph Nodes:  No significant cervical adenopathy. Psych:  Alert and cooperative. Normal mood and affect.  Imaging Studies: Mm 3d Screen Breast Bilateral  Result Date: 05/25/2018 CLINICAL DATA:  Screening. EXAM: DIGITAL SCREENING BILATERAL MAMMOGRAM WITH TOMO AND CAD COMPARISON:  Previous exam(s). ACR Breast Density Category c: The breast tissue is heterogeneously dense, which may obscure small masses. FINDINGS: There are no findings suspicious for malignancy. Images were processed with CAD. IMPRESSION: No mammographic evidence of malignancy. A result letter of this screening mammogram will be mailed directly to the patient. RECOMMENDATION: Screening mammogram in one year. (Code:SM-B-01Y) BI-RADS CATEGORY  1: Negative. Electronically Signed   By: Margarette Canada M.D.   On: 05/25/2018 14:58    Assessment and Plan:   Jacqueline Sanders is a 53 y.o. y/o female has been referred for abdominal discomfort. 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  Plan  1. Trial of amitiza 60mcg dose- samples will be provided 2. Bentyl for pain as needed  3. Trial of IB guard.   Follow up in 4 weeks    Dr Jonathon Bellows MD,MRCP(U.K)

## 2018-06-24 ENCOUNTER — Other Ambulatory Visit: Payer: Self-pay | Admitting: Gastroenterology

## 2018-06-29 ENCOUNTER — Telehealth: Payer: Self-pay | Admitting: Gastroenterology

## 2018-06-29 NOTE — Telephone Encounter (Signed)
Left vm for pt to call office and r/s apt due to corona virus

## 2018-06-30 NOTE — Telephone Encounter (Signed)
Patient was called to have her appointment r/s due to the coronavirus and during the conversationshe nstated the medication given by Dr Vicente Males dicyclomine (BENTYL) 10 MG capsule would counter act with her bladder medication   solifenacin (VESICARE) 10 MG tablet   .Please advise. She was given samples of Amitiza & a card for the pharmacy. She reports this medication did not really work. Please advise.

## 2018-07-03 NOTE — Telephone Encounter (Signed)
Change from Amitiza if on low dose to higher dose. IF already  On  higher dose then change to trulance. Hold off on bentyl for now as it may interact with her bladder medications

## 2018-07-04 ENCOUNTER — Other Ambulatory Visit: Payer: Self-pay

## 2018-07-04 ENCOUNTER — Telehealth: Payer: Self-pay | Admitting: Gastroenterology

## 2018-07-04 ENCOUNTER — Ambulatory Visit: Payer: Self-pay | Admitting: Gastroenterology

## 2018-07-04 MED ORDER — LUBIPROSTONE 24 MCG PO CAPS: 24.0000 ug | ORAL_CAPSULE | Freq: Two times a day (BID) | ORAL | 2 refills | Status: AC

## 2018-07-04 NOTE — Telephone Encounter (Signed)
Spoke with pt and informed her of Dr. Georgeann Oppenheim directions to increase the Amitiza. Pt agrees and is aware that we have sent the prescription for Amitiza 24 mcg BID to her preferred pharmacy.

## 2018-07-04 NOTE — Telephone Encounter (Signed)
Spoke with pt and informed her that I contacted [place] regarding the coverage for the Amitiza. [employer] states pt does not have prescription coverage with [employer] only medical. Pt states she has a separate Rx coverage card with [facility]. Pt states she [facility] if this does not work we will send pt prescription to [place] to have the medication mail-ordered.

## 2018-07-04 NOTE — Telephone Encounter (Signed)
PATIENT CALLED RETURNING Jacqueline Sanders CALL.

## 2018-07-04 NOTE — Telephone Encounter (Signed)
Patient called in stating she could not afford the lubiprostone (AMITIZA) 24 MCG capsule even after insurance it will be $300. Please call her insurance [employer] [phone] to help her. [facility] [place]

## 2018-07-04 NOTE — Telephone Encounter (Signed)
Patient called & states [facility] has faxed [place] a form to have medication preauthorized. If any question please call patient @ [phone].

## 2018-07-04 NOTE — Telephone Encounter (Signed)
Called pt to inform her of Dr. Georgeann Oppenheim directions to change the Amitiza and to hold off on the bentyl.  Unable to contact, LVM to return call

## 2018-07-05 ENCOUNTER — Ambulatory Visit: Payer: Self-pay | Admitting: Gastroenterology

## 2018-07-11 NOTE — Telephone Encounter (Signed)
Received a fax from [facility]  For rx Dicyclimine 10 mg capsule 90 days   [phone]

## 2018-08-14 ENCOUNTER — Ambulatory Visit: Payer: BLUE CROSS/BLUE SHIELD | Admitting: Gastroenterology

## 2019-05-14 ENCOUNTER — Other Ambulatory Visit: Payer: Self-pay | Admitting: Family Medicine

## 2019-05-14 DIAGNOSIS — Z1231 Encounter for screening mammogram for malignant neoplasm of breast: Secondary | ICD-10-CM

## 2019-05-25 ENCOUNTER — Encounter: Payer: Self-pay | Admitting: Podiatry

## 2019-05-25 ENCOUNTER — Other Ambulatory Visit: Payer: Self-pay

## 2019-05-25 ENCOUNTER — Ambulatory Visit (INDEPENDENT_AMBULATORY_CARE_PROVIDER_SITE_OTHER): Payer: BC Managed Care – PPO

## 2019-05-25 ENCOUNTER — Ambulatory Visit: Payer: BC Managed Care – PPO | Admitting: Podiatry

## 2019-05-25 DIAGNOSIS — G5792 Unspecified mononeuropathy of left lower limb: Secondary | ICD-10-CM

## 2019-05-25 DIAGNOSIS — B351 Tinea unguium: Secondary | ICD-10-CM

## 2019-05-25 DIAGNOSIS — G5791 Unspecified mononeuropathy of right lower limb: Secondary | ICD-10-CM

## 2019-05-25 DIAGNOSIS — M79675 Pain in left toe(s): Secondary | ICD-10-CM | POA: Diagnosis not present

## 2019-05-25 DIAGNOSIS — M79674 Pain in right toe(s): Secondary | ICD-10-CM | POA: Diagnosis not present

## 2019-05-25 DIAGNOSIS — M779 Enthesopathy, unspecified: Secondary | ICD-10-CM

## 2019-05-25 MED ORDER — TERBINAFINE HCL 250 MG PO TABS: 250.0000 mg | ORAL_TABLET | Freq: Every day | ORAL | 2 refills | Status: AC

## 2019-05-29 NOTE — Progress Notes (Signed)
   Subjective: 54 y.o. female presenting today as a new patient with a chief complaint of possible nail fungus noted to nails 1-5 bilaterally that appeared 2-3 years ago. She reports associated thickening and darkening of the nails. She reports some associated intermittent tenderness of the nails. She also notes intermittent sharp, shooting pain to the dorsal feet bilaterally, right worse than left. She has been using OTC fungal nail lacquer for the nails but has not done anything for treatment of the foot pain. She denies modifying factors. Patient is here for further evaluation and treatment.   Past Medical History:  Diagnosis Date  . Asthma     Objective: Physical Exam General: The patient is alert and oriented x3 in no acute distress.  Dermatology: Hyperkeratotic, discolored, thickened, onychodystrophy noted to nails 1-5 bilaterally . Skin is warm, dry and supple bilateral lower extremities. Negative for open lesions or macerations.  Vascular: Palpable pedal pulses bilaterally. No edema or erythema noted. Capillary refill within normal limits.  Neurological: Epicritic and protective threshold intact.  Paresthesia with burning, shooting pain noted to the dorsal left foot.   Musculoskeletal Exam: Range of motion within normal limits to all pedal and ankle joints bilateral. Muscle strength 5/5 in all groups bilateral.   Radiographic Exam:  Normal osseous mineralization. Joint spaces preserved. No fracture/dislocation/boney destruction.    Assessment: #1 Onychomycosis nails 1-5 bilaterally  #2 Hyperkeratotic nails 1-5 bilaterally #3 Neuritis left dorsal foot   Plan of Care:  #1 Patient was evaluated. #2 Recommended wide fitting shoes.  #3 Prescription for Lamisil 250 mg #90 provided to patient.  #4 Return to clinic as needed.    Edrick Kins, DPM [facility]  Dr. Edrick Kins, [place]                                        [place],  [place] [address]                Office [phone]  Fax [phone]

## 2019-06-05 ENCOUNTER — Other Ambulatory Visit: Payer: Self-pay | Admitting: Podiatry

## 2019-06-05 DIAGNOSIS — G5792 Unspecified mononeuropathy of left lower limb: Secondary | ICD-10-CM

## 2019-06-05 DIAGNOSIS — G5791 Unspecified mononeuropathy of right lower limb: Secondary | ICD-10-CM

## 2019-06-20 ENCOUNTER — Ambulatory Visit
Admission: RE | Admit: 2019-06-20 | Discharge: 2019-06-20 | Disposition: A | Discharge status: Home / Self Care | Payer: BC Managed Care – PPO | Source: Ambulatory Visit | Attending: Family Medicine | Admitting: Family Medicine

## 2019-06-20 DIAGNOSIS — Z1231 Encounter for screening mammogram for malignant neoplasm of breast: Secondary | ICD-10-CM

## 2019-06-27 ENCOUNTER — Other Ambulatory Visit: Payer: Self-pay | Admitting: Family Medicine

## 2019-06-27 DIAGNOSIS — R928 Other abnormal and inconclusive findings on diagnostic imaging of breast: Secondary | ICD-10-CM

## 2019-06-27 DIAGNOSIS — N6310 Unspecified lump in the right breast, unspecified quadrant: Secondary | ICD-10-CM

## 2019-07-05 ENCOUNTER — Ambulatory Visit
Admission: RE | Admit: 2019-07-05 | Discharge: 2019-07-05 | Disposition: A | Discharge status: Home / Self Care | Payer: BC Managed Care – PPO | Source: Ambulatory Visit | Attending: Family Medicine | Admitting: Family Medicine

## 2019-07-05 DIAGNOSIS — R928 Other abnormal and inconclusive findings on diagnostic imaging of breast: Secondary | ICD-10-CM | POA: Insufficient documentation

## 2019-07-05 DIAGNOSIS — N6310 Unspecified lump in the right breast, unspecified quadrant: Secondary | ICD-10-CM | POA: Insufficient documentation

## 2020-03-28 ENCOUNTER — Other Ambulatory Visit: Payer: Self-pay

## 2020-03-28 ENCOUNTER — Emergency Department
Admission: EM | Admit: 2020-03-28 | Discharge: 2020-03-28 | Disposition: A | Discharge status: Home / Self Care | Payer: BC Managed Care – PPO | Attending: Emergency Medicine | Admitting: Emergency Medicine

## 2020-03-28 ENCOUNTER — Emergency Department: Payer: BC Managed Care – PPO

## 2020-03-28 DIAGNOSIS — R251 Tremor, unspecified: Secondary | ICD-10-CM | POA: Insufficient documentation

## 2020-03-28 DIAGNOSIS — Z91040 Latex allergy status: Secondary | ICD-10-CM | POA: Insufficient documentation

## 2020-03-28 DIAGNOSIS — Z7951 Long term (current) use of inhaled steroids: Secondary | ICD-10-CM | POA: Diagnosis not present

## 2020-03-28 DIAGNOSIS — J45909 Unspecified asthma, uncomplicated: Secondary | ICD-10-CM | POA: Insufficient documentation

## 2020-03-28 LAB — CBC WITH DIFFERENTIAL/PLATELET
Abs Immature Granulocytes: 0.03 10*3/uL (ref 0.00–0.07)
Basophils Absolute: 0 10*3/uL (ref 0.0–0.1)
Basophils Relative: 0 %
Eosinophils Absolute: 0.1 10*3/uL (ref 0.0–0.5)
Eosinophils Relative: 1 %
HCT: 35.5 % — ABNORMAL LOW (ref 36.0–46.0)
Hemoglobin: 11.2 g/dL — ABNORMAL LOW (ref 12.0–15.0)
Immature Granulocytes: 0 %
Lymphocytes Relative: 25 %
Lymphs Abs: 1.9 10*3/uL (ref 0.7–4.0)
MCH: 27.2 pg (ref 26.0–34.0)
MCHC: 31.5 g/dL (ref 30.0–36.0)
MCV: 86.2 fL (ref 80.0–100.0)
Monocytes Absolute: 0.7 10*3/uL (ref 0.1–1.0)
Monocytes Relative: 9 %
Neutro Abs: 4.8 10*3/uL (ref 1.7–7.7)
Neutrophils Relative %: 65 %
Platelets: 332 10*3/uL (ref 150–400)
RBC: 4.12 MIL/uL (ref 3.87–5.11)
RDW: 14.6 % (ref 11.5–15.5)
WBC: 7.5 10*3/uL (ref 4.0–10.5)
nRBC: 0 % (ref 0.0–0.2)

## 2020-03-28 LAB — COMPREHENSIVE METABOLIC PANEL
ALT: 16 U/L (ref 0–44)
AST: 18 U/L (ref 15–41)
Albumin: 3.8 g/dL (ref 3.5–5.0)
Alkaline Phosphatase: 82 U/L (ref 38–126)
Anion gap: 10 (ref 5–15)
BUN: 13 mg/dL (ref 6–20)
CO2: 23 mmol/L (ref 22–32)
Calcium: 8.4 mg/dL — ABNORMAL LOW (ref 8.9–10.3)
Chloride: 107 mmol/L (ref 98–111)
Creatinine, Ser: 0.74 mg/dL (ref 0.44–1.00)
GFR, Estimated: >60 mL/min (ref >60)
Glucose, Bld: 87 mg/dL (ref 70–99)
Potassium: 4.4 mmol/L (ref 3.5–5.1)
Sodium: 140 mmol/L (ref 135–145)
Total Bilirubin: 0.7 mg/dL (ref 0.3–1.2)
Total Protein: 7.4 g/dL (ref 6.5–8.1)

## 2020-03-28 LAB — AMMONIA: Ammonia: 13 umol/L (ref 9–35)

## 2020-03-28 LAB — TSH: TSH: 0.673 u[IU]/mL (ref 0.350–4.500)

## 2020-03-28 LAB — T4, FREE: Free T4: 0.87 ng/dL (ref 0.61–1.12)

## 2020-03-28 NOTE — ED Notes (Addendum)
See triage note. Pt c/o new tremor. Pt pointed to R foot while stating she feels "tremors". No visible tremor noted currently. Pt A&Ox4. Pt reports looked up meds she has been taking to see if any possibly caused tremors. Pt thinks maybe "flonase" has caused it. Reports thinks tremor caused her to fall a week ago. Pt held hands out; has slight tremor. Pt anxious about possible cause of "tremor". Pt denies any issues with her blood glucose.

## 2020-03-28 NOTE — ED Triage Notes (Signed)
PT to ED c/o allergic reaction to Flonase resulting in her upper body shaking, last time she took some was Tuesday night. Says her body has been shaking for almost a week.  PT states she has been on flonase for a while now, but the side of the bottle lists tremors as a possible side effect.  Has had nose bleed.

## 2020-03-28 NOTE — ED Notes (Addendum)
Pt states tremors that began approx 1.5 months ago. Pt states she feels like they are getting worse. Pt appears in no acute distress.

## 2020-03-28 NOTE — ED Provider Notes (Signed)
Emergency Department Provider Note  ____________________________________________  Time seen: Approximately 10:31 PM  I have reviewed the triage vital signs and the nursing notes.   HISTORY  Chief Complaint Tremors   Historian Patient     HPI Jacqueline Sanders is a 54 y.o. female presents to the emergency department with concern for tremors. Patient states that she has been symptomatic for the past month but tremors seem to have acutely worsened in the past 24 hours. Patient denies weakness in the upper and lower extremities. She states that she has been taking Flonase and solifenacin but no other new medications. No psychiatric medications. Patient states that she has had occasional headache since tremor started. No nausea, vomiting or abdominal pain. She states that she has not experienced similar symptoms prior to this month. Denies feeling anxious. No other alleviating measures have been attempted.    Past Medical History:  Diagnosis Date  . Asthma      Immunizations up to date:  Yes.     Past Medical History:  Diagnosis Date  . Asthma     Patient Active Problem List   Diagnosis Date Noted  . Constipation 09/08/2012  . Abnormal uterine bleeding 08/24/2012  . Fibroids 08/24/2012    Past Surgical History:  Procedure Laterality Date  . ABDOMINAL HYSTERECTOMY      Prior to Admission medications   Medication Sig Start Date End Date Taking? Authorizing Provider  albuterol (PROVENTIL HFA;VENTOLIN HFA) 108 (90 Base) MCG/ACT inhaler Inhale 2 puffs into the lungs every 6 (six) hours as needed for wheezing or shortness of breath. 07/02/16   Gregor Hams, MD  amLODipine (NORVASC) 10 MG tablet Take 10 mg by mouth daily. 05/11/19   [provider]  calcium carbonate (TUMS - DOSED IN MG ELEMENTAL CALCIUM) 500 MG chewable tablet Chew by mouth. 08/25/12   [provider]  dicyclomine (BENTYL) 10 MG capsule Take 1 capsule (10 mg total) by mouth 4 (four)  times daily -  before meals and at bedtime. 06/07/18   Jonathon Bellows, MD  Docusate Sodium (DSS) 100 MG CAPS Take by mouth. 08/25/12   [provider]  FLOVENT [facility] 110 MCG/ACT inhaler  04/10/18   [provider]  fluticasone Asencion Islam) 50 MCG/ACT nasal spray  04/10/18   [provider]  polyethylene glycol (MIRALAX) packet Take 17 g by mouth daily. 11/22/17   Loney Hering, MD  solifenacin (VESICARE) 10 MG tablet Take 10 mg by mouth daily. 05/16/18   [provider]  terbinafine (LAMISIL) 250 MG tablet Take 1 tablet (250 mg total) by mouth daily. 05/25/19   Edrick Kins, DPM    Allergies Cephalexin, Aspirin, Cefadroxil, Ibuprofen, Sulfa antibiotics, and Latex  Family History  Problem Relation Age of Onset  . Hypertension Mother   . Diabetes Father   . Breast cancer Sister 68    Social History Social History   Tobacco Use  . Smoking status: Never Smoker  . Smokeless tobacco: Never Used  Substance Use Topics  . Alcohol use: No  . Drug use: No     Review of Systems  Constitutional: No fever/chills Eyes:  No discharge ENT: No upper respiratory complaints. Respiratory: no cough. No SOB/ use of accessory muscles to breath Gastrointestinal:   No nausea, no vomiting.  No diarrhea.  No constipation. Musculoskeletal: Patient has fine tremor of the upper extremities.  Skin: Negative for rash, abrasions, lacerations, ecchymosis.    ____________________________________________   PHYSICAL EXAM:  VITAL SIGNS: ED  Triage Vitals  Enc Vitals Group     BP 03/28/20 1954 (!) 155/77     Pulse Rate 03/28/20 1954 96     Resp 03/28/20 1954 18     Temp 03/28/20 1954 97.9 F (36.6 C)     Temp Source 03/28/20 1954 Oral     SpO2 03/28/20 1954 98 %     Weight 03/28/20 1956 273 lb (123.8 kg)     Height 03/28/20 1956 5\' 8"  (1.727 m)     Head Circumference --      Peak Flow --      Pain Score 03/28/20 1956 9     Pain Loc --      Pain Edu? --      Excl.  in [place]? --      Constitutional: Alert and oriented. Well appearing and in no acute distress. Eyes: Conjunctivae are normal. PERRL. EOMI. Head: Atraumatic. ENT:      Nose: No congestion/rhinnorhea.      Mouth/Throat: Mucous membranes are moist.  Neck: No stridor.  FROM.  Hematological/Lymphatic/Immunilogical: No cervical lymphadenopathy. Cardiovascular: Normal rate, regular rhythm. Normal S1 and S2.  Good peripheral circulation. Respiratory: Normal respiratory effort without tachypnea or retractions. Lungs CTAB. Good air entry to the bases with no decreased or absent breath sounds Gastrointestinal: Bowel sounds x 4 quadrants. Soft and nontender to palpation. No guarding or rigidity. No distention. Musculoskeletal: Full range of motion to all extremities. No obvious deformities noted Neurologic:  Normal for age. No gross focal neurologic deficits are appreciated. Patient has a fine tremor of the upper and lower extremities. Tremors seems to spare the trunk and the head. Skin:  Skin is warm, dry and intact. No rash noted. Psychiatric: Mood and affect are normal for age. Speech and behavior are normal.   ____________________________________________   LABS (all labs ordered are listed, but only abnormal results are displayed)  Labs Reviewed  CBC WITH DIFFERENTIAL/PLATELET - Abnormal; Notable for the following components:      Result Value   Hemoglobin 11.2 (*)    HCT 35.5 (*)    All other components within normal limits  COMPREHENSIVE METABOLIC PANEL - Abnormal; Notable for the following components:   Calcium 8.4 (*)    All other components within normal limits  AMMONIA  T4, FREE  TSH  T3, FREE   ____________________________________________  EKG   ____________________________________________  RADIOLOGY Unk Pinto, personally viewed and evaluated these images (plain radiographs) as part of my medical decision making, as well as reviewing the written report by the  radiologist.    CT Head Wo Contrast  Result Date: 03/28/2020 CLINICAL DATA:  Allergic reaction. EXAM: CT HEAD WITHOUT CONTRAST TECHNIQUE: Contiguous axial images were obtained from the base of the skull through the vertex without intravenous contrast. COMPARISON:  October 15, 2016 FINDINGS: Brain: There is mild cerebral atrophy with widening of the extra-axial spaces and ventricular dilatation. There are areas of decreased attenuation within the white matter tracts of the supratentorial brain, consistent with microvascular disease changes. Vascular: No hyperdense vessel or unexpected calcification. Skull: Normal. Negative for fracture or focal lesion. Sinuses/Orbits: No acute finding. Other: None. IMPRESSION: 1. Generalized cerebral atrophy. 2. No acute intracranial abnormality. Electronically Signed   By: Virgina Norfolk M.D.   On: 03/28/2020 23:16    ____________________________________________    PROCEDURES  Procedure(s) performed:     Procedures     Medications - No data to display   ____________________________________________   INITIAL IMPRESSION /  ASSESSMENT AND PLAN / ED COURSE  Pertinent labs & imaging results that were available during my care of the patient were reviewed by me and considered in my medical decision making (see chart for details).      Assessment and plan: Tremor:  54 year old female presents to the emergency department with concern for a fine tremor of the upper and lower extremities that started a month ago but worsened over the past 2 days.  Patient was hypertensive at triage but vital signs were otherwise reassuring.  Differential diagnosis included cerebellar abnormality, electrolyte imbalance, medication side effect, Parkinson's, benign essential tremor, hyperthyroidism...  CBC and CMP were reassuring.  T4 was within reference range.  TSH was within reference range.  Patient denied taking any psychiatric medications or medications that would  induce tremor.  CT head reveals no acute abnormality.  Recommended that patient follow-up with neurology.  All patient questions were answered.     ____________________________________________  FINAL CLINICAL IMPRESSION(S) / ED DIAGNOSES  Final diagnoses:  Tremor      NEW MEDICATIONS STARTED DURING THIS VISIT:  ED Discharge Orders    None          This chart was dictated using voice recognition software/Dragon. Despite best efforts to proofread, errors can occur which can change the meaning. Any change was purely unintentional.      Lannie Fields, PA-C 03/28/20 2334    Delman Kitten, MD 03/28/20 [phone]

## 2020-03-28 NOTE — Discharge Instructions (Signed)
Please make follow-up appointment with neurology.

## 2020-03-30 LAB — T3, FREE: T3, Free: 2.8 pg/mL (ref 2.0–4.4)

## 2020-09-09 ENCOUNTER — Other Ambulatory Visit: Payer: Self-pay | Admitting: Family Medicine

## 2020-09-09 DIAGNOSIS — Z1231 Encounter for screening mammogram for malignant neoplasm of breast: Secondary | ICD-10-CM

## 2020-11-04 ENCOUNTER — Emergency Department (HOSPITAL_COMMUNITY): Payer: Self-pay | Admitting: Emergency Medicine

## 2021-06-12 ENCOUNTER — Emergency Department (HOSPITAL_BASED_OUTPATIENT_CLINIC_OR_DEPARTMENT_OTHER): Payer: Medicaid Other

## 2021-06-12 ENCOUNTER — Inpatient Hospital Stay (HOSPITAL_BASED_OUTPATIENT_CLINIC_OR_DEPARTMENT_OTHER): Payer: Self-pay

## 2021-06-12 ENCOUNTER — Other Ambulatory Visit: Payer: Self-pay

## 2021-06-12 ENCOUNTER — Encounter (HOSPITAL_BASED_OUTPATIENT_CLINIC_OR_DEPARTMENT_OTHER): Payer: Self-pay

## 2021-06-12 ENCOUNTER — Emergency Department
Admission: EM | Admit: 2021-06-12 | Discharge: 2021-06-12 | Disposition: A | Discharge status: Home / Self Care | Payer: Medicaid Other

## 2021-06-12 DIAGNOSIS — R0600 Dyspnea, unspecified: Secondary | ICD-10-CM

## 2021-06-12 DIAGNOSIS — Z6841 Body Mass Index (BMI) 40.0 and over, adult: Secondary | ICD-10-CM

## 2021-06-12 DIAGNOSIS — Z20822 Contact with and (suspected) exposure to covid-19: Secondary | ICD-10-CM

## 2021-06-12 DIAGNOSIS — J45909 Unspecified asthma, uncomplicated: Secondary | ICD-10-CM | POA: Insufficient documentation

## 2021-06-12 DIAGNOSIS — R0781 Pleurodynia: Secondary | ICD-10-CM

## 2021-06-12 DIAGNOSIS — I10 Essential (primary) hypertension: Secondary | ICD-10-CM | POA: Insufficient documentation

## 2021-06-12 DIAGNOSIS — R0602 Shortness of breath: Secondary | ICD-10-CM | POA: Insufficient documentation

## 2021-06-12 HISTORY — DX: Unspecified asthma, uncomplicated: J45.909

## 2021-06-12 HISTORY — DX: Essential (primary) hypertension: I10

## 2021-06-12 LAB — ARTERIAL BLOOD GAS/LACTATE
BASE EXCESS (ARTERIAL): -0.5 mmol/L
BICARBONATE (ARTERIAL): 23.3 mmol/L
CARBOXYHEMOGLOBIN: 1 % (ref <=1.5)
LACTATE: 0.8 mmol/L (ref <=4.0)
OXYHEMOGLOBIN: 93.2 % (ref 88.0–100.0)
PCO2 (ARTERIAL): 36 mm/Hg (ref 35–45)
PH (ARTERIAL): 7.43 (ref 7.35–7.45)
PO2 (ARTERIAL): 66 mm/Hg — ABNORMAL LOW (ref 80–100)

## 2021-06-12 LAB — ECG 12 LEAD
Atrial Rate: 77 {beats}/min
Calculated P Axis: 35 degrees
Calculated R Axis: -17 degrees
Calculated T Axis: 8 degrees
PR Interval: 140 ms
QRS Duration: 80 ms
QT Interval: 378 ms
QTC Calculation: 427 ms
Ventricular rate: 77 {beats}/min

## 2021-06-12 LAB — COVID-19, FLU A/B, RSV RAPID BY PCR
INFLUENZA VIRUS TYPE A: NOT DETECTED
INFLUENZA VIRUS TYPE B: NOT DETECTED
RESPIRATORY SYNCTIAL VIRUS (RSV): NOT DETECTED
SARS-CoV-2: NOT DETECTED

## 2021-06-12 LAB — CBC WITH DIFF
HCT: 30.2 % — ABNORMAL LOW (ref 37.0–47.0)
HGB: 10.5 g/dL — ABNORMAL LOW (ref 12.5–16.0)
MCH: 28.9 pg (ref 27.0–32.0)
MCHC: 34.7 g/dL (ref 32.0–36.0)
MCV: 83.1 fL (ref 78.0–99.0)
MPV: 7.1 fL — ABNORMAL LOW (ref 7.4–10.4)
PLATELETS: 356 10*3/uL (ref 140–440)
RBC: 3.63 10*6/uL — ABNORMAL LOW (ref 4.20–5.40)
RDW: 16.9 % — ABNORMAL HIGH (ref 11.6–14.8)
WBC: 10.2 10*3/uL (ref 4.0–10.5)

## 2021-06-12 LAB — TROPONIN-I: TROPONIN I: 3 ng/L (ref <15)

## 2021-06-12 LAB — COMPREHENSIVE METABOLIC PANEL, NON-FASTING
ALBUMIN/GLOBULIN RATIO: 0.8 (ref 0.8–1.4)
ALBUMIN: 3.3 g/dL — ABNORMAL LOW (ref 3.4–5.0)
ALKALINE PHOSPHATASE: 84 U/L (ref 46–116)
ALT (SGPT): 11 U/L (ref <=78)
ANION GAP: 10 mmol/L (ref 10–20)
AST (SGOT): 14 U/L — ABNORMAL LOW (ref 15–37)
BILIRUBIN TOTAL: 0.3 mg/dL (ref 0.2–1.0)
BUN/CREA RATIO: 15
BUN: 16 mg/dL (ref 7–18)
CALCIUM, CORRECTED: 9.8 mg/dL
CALCIUM: 9.1 mg/dL (ref 8.5–10.1)
CHLORIDE: 104 mmol/L (ref 98–107)
CO2 TOTAL: 25 mmol/L (ref 21–32)
CREATININE: 1.05 mg/dL — ABNORMAL HIGH (ref 0.55–1.02)
ESTIMATED GFR: 62 mL/min/{1.73_m2} (ref >59)
GLOBULIN: 4.3
GLUCOSE: 95 mg/dL (ref 74–106)
OSMOLALITY, CALCULATED: 279 mOsm/kg (ref 270–290)
POTASSIUM: 3.4 mmol/L — ABNORMAL LOW (ref 3.5–5.1)
PROTEIN TOTAL: 7.6 g/dL (ref 6.4–8.2)
SODIUM: 139 mmol/L (ref 136–145)

## 2021-06-12 LAB — NT-PROBNP: NT-PROBNP: 41 pg/mL (ref <=125)

## 2021-06-12 MED ORDER — IPRATROPIUM 0.5 MG-ALBUTEROL 3 MG (2.5 MG BASE)/3 ML NEBULIZATION SOLN
3.0000 mL | INHALATION_SOLUTION | RESPIRATORY_TRACT | Status: AC
Start: 2021-06-12 — End: 2021-06-12
  Administered 2021-06-12: 3 mL via RESPIRATORY_TRACT

## 2021-06-12 MED ORDER — IPRATROPIUM 0.5 MG-ALBUTEROL 3 MG (2.5 MG BASE)/3 ML NEBULIZATION SOLN
INHALATION_SOLUTION | RESPIRATORY_TRACT | Status: AC
Start: 2021-06-12 — End: 2021-06-12
  Filled 2021-06-12: qty 3

## 2021-06-12 NOTE — ED Triage Notes (Signed)
Patient reports she has a pulled muscle in-between her ribs, started experiencing shortness of breath earlier today while walking. Pain at left-side rib area. Hard to cough today. Worse today.

## 2021-06-12 NOTE — ED Provider Notes (Signed)
[facility] Emergency Department  ED Primary Provider Note  History of Present Illness   Chief Complaint   Patient presents with   . Shortness of Breath     Rhonda Gentry is a 56 y.o. female who had concerns including Shortness of Breath.  Arrival: The patient arrived by Public Transportation    56 year old female patient presents to the ED with a complaint of left-sided lateral rib pain and shortness of breath.  Patient states she had the sudden left rib pain last night and pain the worst with deep breathing, cough and walking.  Short of breath started today while she was working at [employer].  Patient denies chest pain.  Patient states she has history of asthma.  Patient states she got hypertension.  Patient has normal vital signs besides elevated the brother pressure.       Shortness of Breath    Review of Systems   Pertinent positive and negative ROS as per HPI.  Historical Data   History Reviewed This Encounter: Medical History  Surgical History  Family History  Social History      Physical Exam   ED Triage Vitals [06/12/21 1346]   BP (Non-Invasive) (!) 157/91   Heart Rate 85   Respiratory Rate 20   Temperature 36.6 C (97.9 F)   SpO2 100 %   Weight 136 kg (299 lb)   Height 1.524 m (5')     Physical Exam  Vitals and nursing note reviewed.   Constitutional:       Appearance: She is obese.   HENT:      Head: Normocephalic and atraumatic.      Nose: Nose normal.      Mouth/Throat:      Mouth: Mucous membranes are moist.   Eyes:      Extraocular Movements: Extraocular movements intact.      Pupils: Pupils are equal, round, and reactive to light.   Cardiovascular:      Rate and Rhythm: Normal rate and regular rhythm.      Pulses: Normal pulses.   Pulmonary:      Breath sounds: Normal breath sounds.   Abdominal:      General: Bowel sounds are normal.      Palpations: Abdomen is soft.   Musculoskeletal:         General: Normal range of motion.      Cervical back:  Normal range of motion.   Skin:     General: Skin is dry.   Neurological:      General: No focal deficit present.      Mental Status: She is alert and oriented to person, place, and time.   Psychiatric:         Mood and Affect: Mood normal.         Behavior: Behavior normal.       Patient Data     Labs Ordered/Reviewed   ARTERIAL BLOOD GAS/LACTATE - Abnormal; Notable for the following components:       Result Value    PO2 (ARTERIAL) 66 (*)     All other components within normal limits   COMPREHENSIVE METABOLIC PANEL, NON-FASTING - Abnormal; Notable for the following components:    POTASSIUM 3.4 (*)     CREATININE 1.05 (*)     ALBUMIN 3.3 (*)     AST (SGOT) 14 (*)     All other components within normal limits    Narrative:     Estimated  Glomerular Filtration Rate (eGFR) is calculated using the CKD-EPI (2021) equation, intended for patients 51 years of age and older. If gender is not documented or "unknown", there will be no eGFR calculation.   CBC WITH DIFF - Abnormal; Notable for the following components:    RBC 3.63 (*)     HGB 10.5 (*)     HCT 30.2 (*)     RDW 16.9 (*)     MPV 7.1 (*)     All other components within normal limits   NT-PROBNP - Normal   COVID-19, FLU A/B, RSV RAPID BY PCR - Normal    Narrative:     Results are for the simultaneous qualitative identification of SARS-CoV-2 (formerly 2019-nCoV), Influenza A, Influenza B, and RSV RNA. These etiologic agents are generally detectable in nasopharyngeal and nasal swabs during the ACUTE PHASE of infection. Hence, this test is intended to be performed on respiratory specimens collected from individuals with signs and symptoms of upper respiratory tract infection who meet Centers for Disease Control and Prevention (CDC) clinical and/or epidemiological criteria for Coronavirus Disease 2019 (COVID-19) testing. CDC COVID-19 criteria for testing on human specimens is available at [facility] webpage information for Healthcare Professionals: Coronavirus Disease 2019  (COVID-19) ([employer].co[employer]uk).     False-negative results may occur if the virus has genomic mutations, insertions, deletions, or rearrangements or if performed very early in the course of illness. Otherwise, negative results indicate virus specific RNA targets are not detected, however negative results do not preclude SARS-CoV-2 infection/COVID-19, Influenza, or Respiratory syncytial virus infection. Results should not be used as the sole basis for patient management decisions. Negative results must be combined with clinical observations, patient history, and epidemiological information. If upper respiratory tract infection is still suspected based on exposure history together with other clinical findings, re-testing should be considered.    Disclaimer:   This assay has been authorized by FDA under an Emergency Use Authorization for use in laboratories certified under the Clinical Laboratory Improvement Amendments of 1988 (CLIA), 42 U.S.C. [phone], to perform high complexity tests. The impacts of vaccines, antiviral therapeutics, antibiotics, chemotherapeutic or immunosuppressant drugs have not been evaluated.     Test methodology:   Cepheid Xpert Xpress SARS-CoV-2/Flu/RSV Assay real-time polymerase chain reaction (RT-PCR) test on the GeneXpert Dx and Xpert Xpress systems.   TROPONIN-I - Normal    Narrative:     Values received on females ranging between 12-15 ng/L MUST include the next serial troponin to review changes in the delta differences as the reference range for the Access II chemistry analyzer is lower than the established reference range.     CBC/DIFF    Narrative:     The following orders were created for panel order CBC/DIFF.  Procedure                               Abnormality         Status                     ---------                               -----------         ------                     CBC WITH VVZS[[id number]]  Abnormal            Final  result               MANUAL DIFFERENTIAL[[id number]]                                                           Please view results for these tests on the individual orders.     XR CHEST PA AND LATERAL   Final Result by Edi, Radresults In (03/03 1528)   QUESTIONABLE TRACE LEFT PLEURAL FLUID.         Radiologist location ID: [place] Decision Making        Medical Decision Making  He patient complains of the left-sided rib pain and SOB for 1 day.  Unremarkable lab results and Questionable irregularity of left-sided ribs on xray. I discussed it with Dr. Scarlett Presto and decided to discharge pt. Pt denies any injuries. I gave her duoneb and pt states that she feels better. Pt is stable and no acute distress.     Amount and/or Complexity of Data Reviewed  Labs: ordered.  Radiology: ordered.  ECG/medicine tests: ordered.      Risk  Prescription drug management.        ED Course as of 06/12/21 1729   Fri Jun 12, 2021   1727 Pt feels better.          Medications Administered in the ED   ipratropium-albuterol 0.5 mg-3 mg(2.5 mg base)/3 mL Solution for Nebulization (3 mL Nebulization Given 06/12/21 1604)     Clinical Impression   Rib pain on left side (Primary)   Dyspnea, unspecified type       Disposition: Discharged

## 2021-06-23 ENCOUNTER — Encounter (RURAL_HEALTH_CENTER): Payer: Self-pay | Admitting: INTERNAL MEDICINE

## 2021-06-23 DIAGNOSIS — G8929 Other chronic pain: Secondary | ICD-10-CM

## 2021-06-23 DIAGNOSIS — J4521 Mild intermittent asthma with (acute) exacerbation: Secondary | ICD-10-CM

## 2021-06-23 DIAGNOSIS — I10 Essential (primary) hypertension: Secondary | ICD-10-CM | POA: Insufficient documentation

## 2021-06-23 DIAGNOSIS — M549 Dorsalgia, unspecified: Secondary | ICD-10-CM | POA: Insufficient documentation

## 2021-06-23 DIAGNOSIS — E7849 Other hyperlipidemia: Secondary | ICD-10-CM | POA: Insufficient documentation

## 2021-06-23 DIAGNOSIS — J45901 Unspecified asthma with (acute) exacerbation: Secondary | ICD-10-CM | POA: Insufficient documentation

## 2021-06-23 DIAGNOSIS — J4520 Mild intermittent asthma, uncomplicated: Secondary | ICD-10-CM | POA: Insufficient documentation

## 2021-06-23 DIAGNOSIS — N393 Stress incontinence (female) (male): Secondary | ICD-10-CM

## 2021-06-24 ENCOUNTER — Other Ambulatory Visit (RURAL_HEALTH_CENTER): Payer: Self-pay | Admitting: INTERNAL MEDICINE

## 2021-06-25 ENCOUNTER — Ambulatory Visit (RURAL_HEALTH_CENTER): Payer: Medicaid Other | Attending: INTERNAL MEDICINE | Admitting: INTERNAL MEDICINE

## 2021-06-25 ENCOUNTER — Other Ambulatory Visit: Payer: Medicaid Other | Attending: INTERNAL MEDICINE | Admitting: INTERNAL MEDICINE

## 2021-06-25 ENCOUNTER — Other Ambulatory Visit: Payer: Self-pay

## 2021-06-25 ENCOUNTER — Encounter (RURAL_HEALTH_CENTER): Payer: Self-pay | Admitting: INTERNAL MEDICINE

## 2021-06-25 VITALS — BP 159/89 | HR 76 | Resp 20 | Ht 60.0 in | Wt 307.4 lb

## 2021-06-25 DIAGNOSIS — Z91199 Patient's noncompliance with other medical treatment and regimen due to unspecified reason: Secondary | ICD-10-CM | POA: Insufficient documentation

## 2021-06-25 DIAGNOSIS — J4521 Mild intermittent asthma with (acute) exacerbation: Secondary | ICD-10-CM | POA: Insufficient documentation

## 2021-06-25 DIAGNOSIS — I10 Essential (primary) hypertension: Secondary | ICD-10-CM | POA: Insufficient documentation

## 2021-06-25 DIAGNOSIS — E7849 Other hyperlipidemia: Secondary | ICD-10-CM | POA: Insufficient documentation

## 2021-06-25 DIAGNOSIS — M545 Low back pain, unspecified: Secondary | ICD-10-CM | POA: Insufficient documentation

## 2021-06-25 DIAGNOSIS — G8929 Other chronic pain: Secondary | ICD-10-CM | POA: Insufficient documentation

## 2021-06-25 DIAGNOSIS — Z79899 Other long term (current) drug therapy: Secondary | ICD-10-CM | POA: Insufficient documentation

## 2021-06-25 DIAGNOSIS — N393 Stress incontinence (female) (male): Secondary | ICD-10-CM | POA: Insufficient documentation

## 2021-06-25 DIAGNOSIS — J309 Allergic rhinitis, unspecified: Secondary | ICD-10-CM

## 2021-06-25 LAB — COMPREHENSIVE METABOLIC PNL, FASTING
ALBUMIN/GLOBULIN RATIO: 1.1 (ref 0.8–1.4)
ALBUMIN: 3.8 g/dL (ref 3.5–5.7)
ALKALINE PHOSPHATASE: 77 U/L (ref 34–104)
ALT (SGPT): 11 U/L (ref 7–52)
ANION GAP: 7 mmol/L — ABNORMAL LOW (ref 10–20)
AST (SGOT): 14 U/L (ref 13–39)
BILIRUBIN TOTAL: 0.6 mg/dL (ref 0.3–1.2)
BUN/CREA RATIO: 15 (ref 6–22)
BUN: 11 mg/dL (ref 7–25)
CALCIUM, CORRECTED: 9.2 mg/dL (ref 8.9–10.8)
CALCIUM: 9 mg/dL (ref 8.6–10.3)
CHLORIDE: 108 mmol/L — ABNORMAL HIGH (ref 98–107)
CO2 TOTAL: 25 mmol/L (ref 21–31)
CREATININE: 0.72 mg/dL (ref 0.60–1.30)
ESTIMATED GFR: 98 mL/min/{1.73_m2} (ref >59)
GLOBULIN: 3.5 (ref 2.9–5.4)
GLUCOSE: 80 mg/dL (ref 74–109)
OSMOLALITY, CALCULATED: 278 mOsm/kg (ref 270–290)
POTASSIUM: 3.5 mmol/L (ref 3.5–5.1)
PROTEIN TOTAL: 7.3 g/dL (ref 6.4–8.9)
SODIUM: 140 mmol/L (ref 136–145)

## 2021-06-25 LAB — LIPID PANEL
CHOL/HDL RATIO: 5.1
CHOLESTEROL: 226 mg/dL (ref 136–290)
HDL CHOL: 44 mg/dL (ref 23–92)
LDL CALC: 167 mg/dL — ABNORMAL HIGH (ref 0–100)
TRIGLYCERIDES: 74 mg/dL (ref <=150)
VLDL CALC: 15 mg/dL (ref 0–50)

## 2021-06-25 LAB — CBC WITH DIFF
BASOPHIL #: 0 10*3/uL (ref 0.00–2.50)
BASOPHIL %: 1 % (ref 0–3)
EOSINOPHIL #: 0.4 10*3/uL (ref 0.00–2.40)
EOSINOPHIL %: 7 % (ref 0–7)
HCT: 28.7 % — ABNORMAL LOW (ref 37.0–47.0)
HGB: 10.2 g/dL — ABNORMAL LOW (ref 12.5–16.0)
LYMPHOCYTE #: 1.1 10*3/uL — ABNORMAL LOW (ref 2.10–11.00)
LYMPHOCYTE %: 20 % — ABNORMAL LOW (ref 25–45)
MCH: 30.8 pg (ref 27.0–32.0)
MCHC: 35.3 g/dL (ref 32.0–36.0)
MCV: 87.2 fL (ref 78.0–99.0)
MONOCYTE #: 0.5 10*3/uL (ref 0.00–4.10)
MONOCYTE %: 8 % (ref 0–12)
MPV: 7.5 fL (ref 7.4–10.4)
NEUTROPHIL #: 3.6 10*3/uL — ABNORMAL LOW (ref 4.10–29.00)
NEUTROPHIL %: 64 % (ref 40–76)
PLATELETS: 375 10*3/uL (ref 140–440)
RBC: 3.3 10*6/uL — ABNORMAL LOW (ref 4.20–5.40)
RDW: 15 % — ABNORMAL HIGH (ref 11.6–14.8)
WBC: 5.7 10*3/uL (ref 4.0–10.5)
WBCS UNCORRECTED: 5.7 10*3/uL

## 2021-06-25 MED ORDER — DILTIAZEM CD 180 MG CAPSULE,EXTENDED RELEASE 24 HR
180.0000 mg | ORAL_CAPSULE | Freq: Every day | ORAL | 4 refills | Status: DC
Start: 2021-06-25 — End: 2021-09-29

## 2021-06-25 NOTE — Nursing Note (Signed)
Patient is here for her follow up. Patient reports she has stopped taking all of her medications and thinks she is doing better. Patient said she has a little bit of cough from where she was sick and also taking lisinopril.

## 2021-06-25 NOTE — Progress Notes (Signed)
[facility]  [address]  [place], [place]  [address]  Phone: [phone] Fax: [phone]    Name: Rhonda Gentry                       Date of Birth: 06/07/1965   MRN:  [id number]                         Date of visit: 06/25/2021     Chief Complaint: Follow Up 3 Months and Medication Check    Current Outpatient Medications   Medication Sig   . albuterol sulfate (PROVENTIL OR VENTOLIN OR PROAIR) 90 mcg/actuation Inhalation oral inhaler Take 2 Puffs by inhalation Every 6 hours   . diclofenac sodium (VOLTAREN) 1 % Gel Apply 2 g topically Four times a day   . fluticasone propionate (FLONASE) 50 mcg/actuation Nasal Spray, Suspension Administer 1 Spray into each nostril Once a day   . hydroCHLOROthiazide (HYDRODIURIL) 25 mg Oral Tablet TAKE 1 TABLET BY MOUTH EVERY MORNING   . lisinopriL (PRINIVIL) 40 mg Oral Tablet Take 1 Tablet (40 mg total) by mouth Once a day   . mometasone-formoterol (DULERA) 100-5 mcg/dose Inhalation HFA Aerosol Inhaler Take 2 Puffs by inhalation Twice daily   . pravastatin (PRAVACHOL) 40 mg Oral Tablet Take 1 Tablet (40 mg total) by mouth Every evening   . solifenacin (VESICARE) 10 mg Oral Tablet Take 1 Tablet (10 mg total) by mouth Once a day       Patient Active Problem List    Diagnosis Date Noted   . Primary hypertension 06/23/2021   . Other hyperlipidemia 06/23/2021   . Chronic back pain 06/23/2021   . Mild asthma with acute exacerbation 06/23/2021   . Stress incontinence of urine 06/23/2021       Subjective:   Patient is here for CDM visit.    She stopped all medications except for the albuterol and the Flonase. Originally said it was because of her chronic cough.   Wants to see Allergy specialist.     Hypertension  Was On Lisinopril and hctz    Hyperlipidemia  Was On Pravachol  Labs on 02-19-2021 showed total cholesterol 262, triglycerides 61, HDL 51, calculated LDL 199. not on treatment    Back Pain  Had a back injury in 2021.  Was On Vontaren    Asthma  Was On Albuterol and  Dulera  Exacerbation 02-2021, 04-2021  Dulera added 04-2021    Urinary Incontinence  Was On Solifenacin    Knee Pain  XR 04-2021 showed mild degenerative changes    ROS:  10 systems reviewed and were negative except as noted.   + dyspnea  + back pain    Objective :  BP (!) 159/89 (Site: Left, Patient Position: Sitting, Cuff Size: Adult Large)   Pulse 76   Resp 20   Ht 1.524 m (5')   Wt (!) 139 kg (307 lb 6 oz)   SpO2 (!) 74%   BMI 60.03 kg/m       General:  appears in good health  Lungs:  Clear to auscultation bilaterally.   Cardiovascular:  regular rate and rhythm  Neurologic:  Grossly normal  Psychiatric:  Normal    Data reviewed:      Assessment/Plan:  1. Mild intermittent asthma with acute exacerbation    2. Other hyperlipidemia    3. Stress incontinence of urine    4. Chronic low back pain  without sciatica, unspecified back pain laterality    5. Primary hypertension      Hypertension  Try cardizem    Asthma  Continue albuterol HFA  Refer to Dr Sherilyn Banker    Hyperlipidemia  monitor    Urinary Incontincence  monitor    Back pain  follow with chiropractor    Knee Pain  Monitor    Irritable Bowel  Was following with Endo in [place]. No endoscopy  has not rescheduled apt with Dr Allena Katz    Noncompliance  Counseling provided    Terald Sleeper, DO, [facility]  Internal Medicine

## 2021-06-29 ENCOUNTER — Telehealth (RURAL_HEALTH_CENTER): Payer: Self-pay | Admitting: INTERNAL MEDICINE

## 2021-06-29 NOTE — Telephone Encounter (Signed)
-----   Message from Terald Sleeper, DO sent at 06/29/2021 10:36 AM EDT -----  Cholesterol worse, so I strongly recommend we start her back on medication.  Otherwise labs about the same

## 2021-06-29 NOTE — Result Encounter Note (Signed)
Cholesterol worse, so I strongly recommend we start her back on medication.  Otherwise labs about the same

## 2021-06-29 NOTE — Nursing Note (Signed)
Patient was notified of results and doesn't want to take new meds but said she would take a few days to think about it and let us know

## 2021-07-28 ENCOUNTER — Telehealth (RURAL_HEALTH_CENTER): Payer: Self-pay | Admitting: INTERNAL MEDICINE

## 2021-07-28 NOTE — Telephone Encounter (Signed)
With her insurance, [facility] is the only psych organization that will regularly take it.    [facility] does not accept referrals. The patient has to call and set things up themselves.

## 2021-07-28 NOTE — Telephone Encounter (Signed)
Patient was notified.

## 2021-07-28 NOTE — Telephone Encounter (Signed)
Patient said that she seen Dr. Darrick Penna for her SSI and was told that she needed a referral to a psychiatrist.  She is wanting to know if you can do this for her?

## 2021-08-11 ENCOUNTER — Other Ambulatory Visit (INDEPENDENT_AMBULATORY_CARE_PROVIDER_SITE_OTHER): Payer: Self-pay

## 2021-08-11 DIAGNOSIS — Z7189 Other specified counseling: Secondary | ICD-10-CM

## 2021-08-11 NOTE — Nursing Note (Signed)
Population Health  Attribution Verification   DATE: 08/11/2021, 15:32  PATIENT NAME: Rhonda Gentry  MRN: [id number]  DOB: 06-Jan-1966  PCP: Terald Sleeper, DO  INSURANCE: Payor: Dimple Casey / Plan: [employer] MEDICAID / Product Type: Medicaid [place] /     REASON FOR ENCOUNTER:   According to the patient's insurance records, they are attributed to a [facility] Primary Care Physician (PCP) for their healthcare needs.     Outreach made to the patient to clarify their choice of PCP, to either assist with arranging a PCP appointment or attempt to remove the patient from [facility] patient attribution. Electronic Health Record care team will be updated with accurate PCP.     Contact Attempt:   Contact attempt 1 made to patient via phone call    Outcome:  Patient currently see a [facility] PCP. PCP was updated in Electronic Health Records and patient did not want an appointment.     Future Appointments   Date Time Provider Department Center   09/29/2021  1:00 PM McClanahan, Mardee Postin, DO [facility]       Health Maintenance Due   Topic Date Due   . Hepatitis B Vaccine (1 of 3 - 3-dose series) Never done   . Pneumococcal Vaccine, Age 74-64 (1 - PCV) Never done   . HIV Screening  Never done   . Adult Tdap-Td (1 - Tdap) Never done   . Colonoscopy  Never done   . Mammography  Never done   . Shingles Vaccine (1 of 2) Never done   . Covid-19 Vaccine (5 - Booster for Moderna series) 01/30/2021       Encouraged patient to contact their insurance's member services to update their selected PCP.      Bing Matter, PATIENT NAVIGATOR  08/11/2021, 15:32  Patient Navigator, Population Health

## 2021-09-29 ENCOUNTER — Encounter (RURAL_HEALTH_CENTER): Payer: Self-pay | Admitting: INTERNAL MEDICINE

## 2021-09-29 ENCOUNTER — Other Ambulatory Visit: Payer: Self-pay

## 2021-09-29 ENCOUNTER — Other Ambulatory Visit (RURAL_HEALTH_CENTER): Payer: Self-pay | Admitting: INTERNAL MEDICINE

## 2021-09-29 ENCOUNTER — Ambulatory Visit (RURAL_HEALTH_CENTER): Payer: Medicaid Other | Attending: INTERNAL MEDICINE | Admitting: INTERNAL MEDICINE

## 2021-09-29 VITALS — BP 141/94 | HR 60 | Resp 18 | Ht 68.0 in | Wt 316.4 lb

## 2021-09-29 DIAGNOSIS — J4521 Mild intermittent asthma with (acute) exacerbation: Secondary | ICD-10-CM

## 2021-09-29 DIAGNOSIS — N393 Stress incontinence (female) (male): Secondary | ICD-10-CM | POA: Insufficient documentation

## 2021-09-29 DIAGNOSIS — Z91199 Patient's noncompliance with other medical treatment and regimen due to unspecified reason: Secondary | ICD-10-CM | POA: Insufficient documentation

## 2021-09-29 DIAGNOSIS — I10 Essential (primary) hypertension: Secondary | ICD-10-CM | POA: Insufficient documentation

## 2021-09-29 DIAGNOSIS — E7849 Other hyperlipidemia: Secondary | ICD-10-CM | POA: Insufficient documentation

## 2021-09-29 DIAGNOSIS — G8929 Other chronic pain: Secondary | ICD-10-CM

## 2021-09-29 DIAGNOSIS — F4310 Post-traumatic stress disorder, unspecified: Secondary | ICD-10-CM | POA: Insufficient documentation

## 2021-09-29 MED ORDER — DILTIAZEM CD 180 MG CAPSULE,EXTENDED RELEASE 24 HR
180.0000 mg | ORAL_CAPSULE | Freq: Every day | ORAL | 4 refills | Status: DC
Start: 2021-09-29 — End: 2021-12-31

## 2021-09-29 MED ORDER — FAMOTIDINE 40 MG TABLET
40.0000 mg | ORAL_TABLET | Freq: Two times a day (BID) | ORAL | 1 refills | Status: DC
Start: 2021-09-29 — End: 2021-12-31

## 2021-09-29 MED ORDER — HYDROCHLOROTHIAZIDE 25 MG TABLET
25.0000 mg | ORAL_TABLET | Freq: Every day | ORAL | 1 refills | Status: DC
Start: 2021-09-29 — End: 2021-12-31

## 2021-09-29 MED ORDER — SOLIFENACIN 10 MG TABLET
10.0000 mg | ORAL_TABLET | Freq: Every day | ORAL | 1 refills | Status: DC
Start: 2021-09-29 — End: 2021-12-31

## 2021-09-29 NOTE — Nursing Note (Signed)
Patient is here for her follow up. Patient reports no new problems.

## 2021-09-29 NOTE — Progress Notes (Signed)
[facility]  [address]  [place], [place]  [address]  Phone: [phone] Fax: [phone]    Name: GENINE BECKETT                       Date of Birth: September 28, 1965   MRN:  [id number]                         Date of visit: 09/29/2021     Chief Complaint: Follow Up 3 Months (No new problems)    Current Outpatient Medications   Medication Sig   . ADVAIR HFA 115-21 mcg/actuation Inhalation oral inhaler Take 2 Puffs by inhalation Once a day   . albuterol sulfate (PROVENTIL OR VENTOLIN OR PROAIR) 90 mcg/actuation Inhalation oral inhaler Take 2 Puffs by inhalation Every 6 hours (Patient not taking: Reported on 09/29/2021)   . dilTIAZem (CARDIZEM CD) 180 mg Oral Capsule, Sust. Release 24 hr Take 1 Capsule (180 mg total) by mouth Once a day   . fluticasone propionate (FLONASE) 50 mcg/actuation Nasal Spray, Suspension Administer 1 Spray into each nostril Once a day   . hydroCHLOROthiazide (HYDRODIURIL) 25 mg Oral Tablet Take 1 Tablet (25 mg total) by mouth Once a day   . montelukast (SINGULAIR) 10 mg Oral Tablet Take 1 Tablet (10 mg total) by mouth Once a day   . solifenacin (VESICARE) 10 mg Oral Tablet Take 1 Tablet (10 mg total) by mouth Once a day       Patient Active Problem List    Diagnosis Date Noted   . Primary hypertension 06/23/2021   . Other hyperlipidemia 06/23/2021   . Chronic back pain 06/23/2021   . Mild asthma with acute exacerbation 06/23/2021   . Stress incontinence of urine 06/23/2021       Subjective:   Patient is here for CDM visit.    Patient called and stated that she was told she needed to see Psych for her SSI. However with her insurance, the only providers that would take her are at [facility] and they do not accept referrals from [place]. Patient was informed she needed to call to schedule.    States she did the intake through [facility], but has not gotten an appointment scheduled yet.     Hypertension  Was On Lisinopril and hctz    Hyperlipidemia  Was On Pravachol. She  stopped.   Labs on 02-19-2021 showed total cholesterol 262, triglycerides 61, HDL 51, calculated LDL 199. not on treatment  Labs on 06/25/2021 showed total cholesterol 226, triglycerides 74, HDL 44, calculated LDL 167.  Patient informed she needed to resume the Pravachol    Back Pain  Had a back injury in 2021.  Was On Voltaren    Asthma  Was On Albuterol and Dulera  Exacerbation 02-2021, 04-2021  [facility] added 04-2021  06-2021 Patient stopped [facility]  [id number] Saw Dr Sherilyn Banker. Had PFT's.     Urinary Incontinence  Was On Solifenacin. Patient stopped.   09-2021 She resumed    Knee Pain  XR 04-2021 showed mild degenerative changes    ROS:  10 systems reviewed and were negative except as noted.   + dyspnea  + back pain    Objective :  BP (!) 141/94 (Site: Left, Patient Position: Sitting, Cuff Size: Adult)   Pulse 60   Resp 18   Ht 1.727 m (5\' 8" )   Wt (!) 144 kg (316 lb 6 oz)  SpO2 98%   BMI 48.10 kg/m       General:  appears in good health  Lungs:  Clear to auscultation bilaterally.   Cardiovascular:  regular rate and rhythm  Neurologic:  Grossly normal  Psychiatric:  Normal    Data reviewed:      Assessment/Plan:  Assessment/Plan   1. Primary hypertension    2. Other hyperlipidemia    3. Mild intermittent asthma with acute exacerbation    4. Stress incontinence of urine    5. Chronic low back pain without sciatica, unspecified back pain laterality      Hypertension  Continue cardizem and HCTZ    Asthma  Follow with Dr Sherilyn Banker    Hyperlipidemia  Monitor  She does not want to resume statin    Urinary Incontincence  Continue solifenacin    Back pain  follow with chiropractor    Knee Pain  Monitor    Irritable Bowel  Was following with GI in [place]. No endoscopy  has not rescheduled apt with Dr Allena Katz    PTSD  Self reported  Patient wants to try referral to [facility].     Noncompliance  Counseling provided    Terald Sleeper, DO, [facility]  Internal Medicine

## 2021-10-19 ENCOUNTER — Ambulatory Visit: Payer: Medicaid Other | Attending: PHYSICIAN ASSISTANT | Admitting: PHYSICIAN ASSISTANT

## 2021-10-19 ENCOUNTER — Encounter (HOSPITAL_PSYCHIATRIC): Payer: Self-pay | Admitting: PHYSICIAN ASSISTANT

## 2021-10-19 ENCOUNTER — Other Ambulatory Visit: Payer: Self-pay

## 2021-10-19 VITALS — BP 122/71 | HR 77 | Resp 18 | Ht 68.0 in | Wt 316.0 lb

## 2021-10-19 DIAGNOSIS — F3113 Bipolar disorder, current episode manic without psychotic features, severe: Secondary | ICD-10-CM | POA: Insufficient documentation

## 2021-10-19 DIAGNOSIS — Z6841 Body Mass Index (BMI) 40.0 and over, adult: Secondary | ICD-10-CM

## 2021-10-19 DIAGNOSIS — F4310 Post-traumatic stress disorder, unspecified: Secondary | ICD-10-CM | POA: Insufficient documentation

## 2021-10-19 DIAGNOSIS — Z638 Other specified problems related to primary support group: Secondary | ICD-10-CM

## 2021-10-19 MED ORDER — OXCARBAZEPINE 150 MG TABLET
150.0000 mg | ORAL_TABLET | Freq: Two times a day (BID) | ORAL | 0 refills | Status: DC
Start: 2021-10-19 — End: 2021-11-11
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
## 2021-10-19 NOTE — Patient Instructions (Signed)
Take medications as prescribed, avoid drugs and alcohol, call office if symptoms worsen or problems arise.  [phone].

## 2021-11-11 ENCOUNTER — Other Ambulatory Visit (HOSPITAL_PSYCHIATRIC): Payer: Self-pay | Admitting: PHYSICIAN ASSISTANT

## 2021-11-20 ENCOUNTER — Ambulatory Visit (HOSPITAL_PSYCHIATRIC): Payer: Self-pay | Admitting: PHYSICIAN ASSISTANT

## 2021-12-31 ENCOUNTER — Ambulatory Visit (RURAL_HEALTH_CENTER): Payer: Medicaid Other | Attending: INTERNAL MEDICINE | Admitting: INTERNAL MEDICINE

## 2021-12-31 ENCOUNTER — Ambulatory Visit: Payer: Medicaid Other | Attending: INTERNAL MEDICINE | Admitting: INTERNAL MEDICINE

## 2021-12-31 ENCOUNTER — Other Ambulatory Visit: Payer: Self-pay

## 2021-12-31 ENCOUNTER — Encounter (RURAL_HEALTH_CENTER): Payer: Self-pay | Admitting: INTERNAL MEDICINE

## 2021-12-31 VITALS — BP 127/73 | HR 72 | Resp 18 | Ht 68.0 in | Wt 311.4 lb

## 2021-12-31 DIAGNOSIS — Z91199 Patient's noncompliance with other medical treatment and regimen due to unspecified reason: Secondary | ICD-10-CM | POA: Insufficient documentation

## 2021-12-31 DIAGNOSIS — G8929 Other chronic pain: Secondary | ICD-10-CM | POA: Insufficient documentation

## 2021-12-31 DIAGNOSIS — K589 Irritable bowel syndrome without diarrhea: Secondary | ICD-10-CM | POA: Insufficient documentation

## 2021-12-31 DIAGNOSIS — J4521 Mild intermittent asthma with (acute) exacerbation: Secondary | ICD-10-CM | POA: Insufficient documentation

## 2021-12-31 DIAGNOSIS — N393 Stress incontinence (female) (male): Secondary | ICD-10-CM | POA: Insufficient documentation

## 2021-12-31 DIAGNOSIS — I10 Essential (primary) hypertension: Secondary | ICD-10-CM | POA: Insufficient documentation

## 2021-12-31 DIAGNOSIS — M545 Low back pain, unspecified: Secondary | ICD-10-CM | POA: Insufficient documentation

## 2021-12-31 DIAGNOSIS — E7849 Other hyperlipidemia: Secondary | ICD-10-CM | POA: Insufficient documentation

## 2021-12-31 MED ORDER — FAMOTIDINE 40 MG TABLET
40.0000 mg | ORAL_TABLET | Freq: Two times a day (BID) | ORAL | 1 refills | Status: DC
Start: 2021-12-31 — End: 2022-06-18

## 2021-12-31 MED ORDER — DILTIAZEM CD 180 MG CAPSULE,EXTENDED RELEASE 24 HR
180.0000 mg | ORAL_CAPSULE | Freq: Every day | ORAL | 4 refills | Status: DC
Start: 2021-12-31 — End: 2022-06-18

## 2021-12-31 MED ORDER — AEROCHAMBER W/ FLOWSIGNAL SPACER
0 refills | Status: DC
Start: 2021-12-31 — End: 2024-03-05

## 2021-12-31 MED ORDER — SOLIFENACIN 10 MG TABLET
10.0000 mg | ORAL_TABLET | Freq: Every day | ORAL | 1 refills | Status: DC
Start: 2021-12-31 — End: 2022-06-18

## 2021-12-31 MED ORDER — HYDROCHLOROTHIAZIDE 25 MG TABLET
25.0000 mg | ORAL_TABLET | Freq: Every day | ORAL | 1 refills | Status: DC
Start: 2021-12-31 — End: 2022-06-18

## 2021-12-31 NOTE — Nursing Note (Signed)
Patient is here for her follow up. Patient reports she has trouble falling asleep at nights.
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
## 2022-01-01 LAB — CBC WITH DIFF
BASOPHIL #: 0 10*3/uL (ref 0.00–0.10)
BASOPHIL %: 1 % (ref 0–1)
EOSINOPHIL #: 0.1 10*3/uL (ref 0.00–0.50)
EOSINOPHIL %: 2 %
HCT: 34.3 % (ref 31.2–41.9)
HGB: 11.5 g/dL (ref 10.9–14.3)
LYMPHOCYTE #: 1.4 10*3/uL (ref 1.00–3.00)
LYMPHOCYTE %: 19 % (ref 16–44)
MCH: 27.4 pg (ref 24.7–32.8)
MCHC: 33.5 g/dL (ref 32.3–35.6)
MCV: 81.7 fL (ref 75.5–95.3)
MONOCYTE #: 0.6 10*3/uL (ref 0.30–1.00)
MONOCYTE %: 8 % (ref 5–13)
MPV: 7.6 fL — ABNORMAL LOW (ref 7.9–10.8)
NEUTROPHIL #: 5 10*3/uL (ref 1.85–7.80)
NEUTROPHIL %: 71 % (ref 43–77)
PLATELETS: 416 10*3/uL (ref 140–440)
RBC: 4.2 10*6/uL (ref 3.63–4.92)
RDW: 15 % (ref 12.3–17.7)
WBC: 7.1 10*3/uL (ref 3.8–11.8)

## 2022-01-01 LAB — LIPID PANEL
CHOL/HDL RATIO: 5.5
CHOLESTEROL: 237 mg/dL — ABNORMAL HIGH (ref <200)
HDL CHOL: 43 mg/dL (ref 23–92)
LDL CALC: 179 mg/dL — ABNORMAL HIGH (ref 0–100)
TRIGLYCERIDES: 76 mg/dL (ref <=150)
VLDL CALC: 15 mg/dL (ref 0–50)

## 2022-01-01 LAB — COMPREHENSIVE METABOLIC PNL, FASTING
ALBUMIN/GLOBULIN RATIO: 1.1 (ref 0.8–1.4)
ALBUMIN: 3.9 g/dL (ref 3.5–5.7)
ALKALINE PHOSPHATASE: 81 U/L (ref 34–104)
ALT (SGPT): 10 U/L (ref 7–52)
ANION GAP: 8 mmol/L (ref 4–13)
AST (SGOT): 14 U/L (ref 13–39)
BILIRUBIN TOTAL: 0.4 mg/dL (ref 0.3–1.2)
BUN/CREA RATIO: 17 (ref 6–22)
BUN: 14 mg/dL (ref 7–25)
CALCIUM, CORRECTED: 9.3 mg/dL (ref 8.9–10.8)
CALCIUM: 9.2 mg/dL (ref 8.6–10.3)
CHLORIDE: 104 mmol/L (ref 98–107)
CO2 TOTAL: 27 mmol/L (ref 21–31)
CREATININE: 0.84 mg/dL (ref 0.60–1.30)
ESTIMATED GFR: 81 mL/min/{1.73_m2} (ref >59)
GLOBULIN: 3.6 (ref 2.9–5.4)
GLUCOSE: 83 mg/dL (ref 74–109)
OSMOLALITY, CALCULATED: 277 mOsm/kg (ref 270–290)
POTASSIUM: 3.3 mmol/L — ABNORMAL LOW (ref 3.5–5.1)
PROTEIN TOTAL: 7.5 g/dL (ref 6.4–8.9)
SODIUM: 139 mmol/L (ref 136–145)

## 2022-01-02 LAB — LDL CHOLESTEROL, DIRECT: LDL DIRECT: 212 mg/dL — ABNORMAL HIGH (ref <100)

## 2022-01-04 ENCOUNTER — Telehealth (RURAL_HEALTH_CENTER): Payer: Self-pay | Admitting: INTERNAL MEDICINE

## 2022-01-04 ENCOUNTER — Other Ambulatory Visit (RURAL_HEALTH_CENTER): Payer: Self-pay | Admitting: INTERNAL MEDICINE

## 2022-01-04 MED ORDER — POTASSIUM CHLORIDE ER 20 MEQ TABLET,EXTENDED RELEASE(PART/CRYST)
20.0000 meq | ORAL_TABLET | Freq: Every day | ORAL | 0 refills | Status: DC
Start: 2022-01-04 — End: 2022-03-01

## 2022-01-04 NOTE — Telephone Encounter (Signed)
-----   Message from Casimer Lanius, DO sent at 01/04/2022  9:03 AM EDT -----  Cholesterol still too high. She has not wanted to go back on medication  Potassium is too low. I'm going to send her in something, but they are good size pills  Other labs look ok

## 2022-01-04 NOTE — Telephone Encounter (Signed)
Called pt left message for her to call back regarding labs

## 2022-01-05 NOTE — Telephone Encounter (Signed)
Pt was notified of results

## 2022-03-01 ENCOUNTER — Other Ambulatory Visit (RURAL_HEALTH_CENTER): Payer: Self-pay | Admitting: INTERNAL MEDICINE

## 2022-04-13 ENCOUNTER — Ambulatory Visit (RURAL_HEALTH_CENTER): Payer: Self-pay | Admitting: INTERNAL MEDICINE

## 2022-06-18 ENCOUNTER — Other Ambulatory Visit: Payer: Self-pay

## 2022-06-18 ENCOUNTER — Ambulatory Visit: Payer: Medicaid Other | Attending: INTERNAL MEDICINE | Admitting: INTERNAL MEDICINE

## 2022-06-18 ENCOUNTER — Ambulatory Visit (RURAL_HEALTH_CENTER): Payer: Medicaid Other | Attending: INTERNAL MEDICINE | Admitting: INTERNAL MEDICINE

## 2022-06-18 ENCOUNTER — Encounter (RURAL_HEALTH_CENTER): Payer: Self-pay | Admitting: INTERNAL MEDICINE

## 2022-06-18 VITALS — BP 131/78 | HR 72 | Resp 18 | Ht 68.0 in | Wt 296.5 lb

## 2022-06-18 DIAGNOSIS — Z1231 Encounter for screening mammogram for malignant neoplasm of breast: Secondary | ICD-10-CM

## 2022-06-18 DIAGNOSIS — M545 Low back pain, unspecified: Secondary | ICD-10-CM | POA: Insufficient documentation

## 2022-06-18 DIAGNOSIS — I10 Essential (primary) hypertension: Secondary | ICD-10-CM | POA: Insufficient documentation

## 2022-06-18 DIAGNOSIS — E7849 Other hyperlipidemia: Secondary | ICD-10-CM | POA: Insufficient documentation

## 2022-06-18 DIAGNOSIS — G8929 Other chronic pain: Secondary | ICD-10-CM | POA: Insufficient documentation

## 2022-06-18 DIAGNOSIS — R400 Somnolence: Secondary | ICD-10-CM | POA: Insufficient documentation

## 2022-06-18 DIAGNOSIS — R0683 Snoring: Secondary | ICD-10-CM | POA: Insufficient documentation

## 2022-06-18 DIAGNOSIS — J4521 Mild intermittent asthma with (acute) exacerbation: Secondary | ICD-10-CM | POA: Insufficient documentation

## 2022-06-18 DIAGNOSIS — K589 Irritable bowel syndrome without diarrhea: Secondary | ICD-10-CM | POA: Insufficient documentation

## 2022-06-18 DIAGNOSIS — N393 Stress incontinence (female) (male): Secondary | ICD-10-CM | POA: Insufficient documentation

## 2022-06-18 LAB — CBC WITH DIFF
BASOPHIL #: 0 10*3/uL (ref 0.00–0.10)
BASOPHIL %: 1 % (ref 0–1)
EOSINOPHIL #: 0.2 10*3/uL (ref 0.00–0.50)
EOSINOPHIL %: 2 %
HCT: 33.8 % (ref 31.2–41.9)
HGB: 11.4 g/dL (ref 10.9–14.3)
LYMPHOCYTE #: 2.4 10*3/uL (ref 1.00–3.00)
LYMPHOCYTE %: 30 % (ref 16–44)
MCH: 27.2 pg (ref 24.7–32.8)
MCHC: 33.7 g/dL (ref 32.3–35.6)
MCV: 80.7 fL (ref 75.5–95.3)
MONOCYTE #: 0.6 10*3/uL (ref 0.30–1.00)
MONOCYTE %: 8 % (ref 5–13)
MPV: 7.3 fL — ABNORMAL LOW (ref 7.9–10.8)
NEUTROPHIL #: 4.8 10*3/uL (ref 1.85–7.80)
NEUTROPHIL %: 60 % (ref 43–77)
PLATELETS: 391 10*3/uL (ref 140–440)
RBC: 4.19 10*6/uL (ref 3.63–4.92)
RDW: 15.6 % (ref 12.3–17.7)
WBC: 8 10*3/uL (ref 3.8–11.8)

## 2022-06-18 LAB — LIPID PANEL
CHOL/HDL RATIO: 5
CHOLESTEROL: 212 mg/dL — ABNORMAL HIGH (ref <200)
HDL CHOL: 42 mg/dL (ref 23–92)
LDL CALC: 150 mg/dL — ABNORMAL HIGH (ref 0–100)
TRIGLYCERIDES: 102 mg/dL (ref <=150)
VLDL CALC: 20 mg/dL (ref 0–50)

## 2022-06-18 LAB — COMPREHENSIVE METABOLIC PNL, FASTING
ALBUMIN/GLOBULIN RATIO: 1.1 (ref 0.8–1.4)
ALBUMIN: 3.8 g/dL (ref 3.5–5.7)
ALKALINE PHOSPHATASE: 80 U/L (ref 34–104)
ALT (SGPT): 12 U/L (ref 7–52)
ANION GAP: 5 mmol/L (ref 4–13)
AST (SGOT): 12 U/L — ABNORMAL LOW (ref 13–39)
BILIRUBIN TOTAL: 0.3 mg/dL (ref 0.3–1.2)
BUN/CREA RATIO: 17 (ref 6–22)
BUN: 13 mg/dL (ref 7–25)
CALCIUM, CORRECTED: 9.2 mg/dL (ref 8.9–10.8)
CALCIUM: 9 mg/dL (ref 8.6–10.3)
CHLORIDE: 104 mmol/L (ref 98–107)
CO2 TOTAL: 29 mmol/L (ref 21–31)
CREATININE: 0.77 mg/dL (ref 0.60–1.30)
ESTIMATED GFR: 90 mL/min/{1.73_m2} (ref >59)
GLOBULIN: 3.4 (ref 2.9–5.4)
GLUCOSE: 80 mg/dL (ref 74–109)
OSMOLALITY, CALCULATED: 275 mOsm/kg (ref 270–290)
POTASSIUM: 3.4 mmol/L — ABNORMAL LOW (ref 3.5–5.1)
PROTEIN TOTAL: 7.2 g/dL (ref 6.4–8.9)
SODIUM: 138 mmol/L (ref 136–145)

## 2022-06-18 MED ORDER — SOLIFENACIN 10 MG TABLET
10.0000 mg | ORAL_TABLET | Freq: Every day | ORAL | 1 refills | Status: DC
Start: 2022-06-18 — End: 2022-10-06

## 2022-06-18 MED ORDER — FAMOTIDINE 40 MG TABLET
40.0000 mg | ORAL_TABLET | Freq: Two times a day (BID) | ORAL | 1 refills | Status: DC
Start: 2022-06-18 — End: 2022-10-06

## 2022-06-18 MED ORDER — HYDROCHLOROTHIAZIDE 25 MG TABLET
25.0000 mg | ORAL_TABLET | Freq: Every day | ORAL | 1 refills | Status: DC
Start: 2022-06-18 — End: 2022-10-06

## 2022-06-18 MED ORDER — POTASSIUM CHLORIDE ER 20 MEQ TABLET,EXTENDED RELEASE(PART/CRYST)
ORAL_TABLET | ORAL | 1 refills | Status: DC
Start: 2022-06-18 — End: 2022-07-16

## 2022-06-18 MED ORDER — ADVAIR HFA 115 MCG-21 MCG/ACTUATION AEROSOL INHALER
2.0000 | INHALATION_SPRAY | Freq: Every day | RESPIRATORY_TRACT | 1 refills | Status: DC
Start: 2022-06-18 — End: 2022-10-06

## 2022-06-18 MED ORDER — ALBUTEROL SULFATE HFA 90 MCG/ACTUATION AEROSOL INHALER
2.0000 | INHALATION_SPRAY | Freq: Four times a day (QID) | RESPIRATORY_TRACT | 1 refills | Status: DC
Start: 2022-06-18 — End: 2022-10-06

## 2022-06-18 MED ORDER — DICYCLOMINE 10 MG CAPSULE
10.0000 mg | ORAL_CAPSULE | Freq: Four times a day (QID) | ORAL | 1 refills | Status: DC
Start: 2022-06-18 — End: 2022-10-06

## 2022-06-18 MED ORDER — DILTIAZEM CD 180 MG CAPSULE,EXTENDED RELEASE 24 HR
180.0000 mg | ORAL_CAPSULE | Freq: Every day | ORAL | 1 refills | Status: DC
Start: 2022-06-18 — End: 2022-10-06

## 2022-06-18 MED ORDER — MONTELUKAST 10 MG TABLET
10.0000 mg | ORAL_TABLET | Freq: Every day | ORAL | 1 refills | Status: DC
Start: 2022-06-18 — End: 2022-10-06

## 2022-06-18 MED ORDER — FLUTICASONE PROPIONATE 50 MCG/ACTUATION NASAL SPRAY,SUSPENSION
1.0000 | Freq: Every day | NASAL | 1 refills | Status: DC
Start: 2022-06-18 — End: 2022-10-06
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
## 2022-06-18 NOTE — Addendum Note (Signed)
Addended byAlinda Dooms, Amonda Brillhart on: 06/18/2022 11:34 AM     Modules accepted: Orders

## 2022-06-18 NOTE — Nursing Note (Signed)
Patient is here for her follow up. Patient reports she has a knot that sometimes pops up in the middle of her stomach.

## 2022-06-19 LAB — LDL CHOLESTEROL, DIRECT: LDL DIRECT: 173 mg/dL — ABNORMAL HIGH (ref <100)

## 2022-06-21 ENCOUNTER — Ambulatory Visit
Admission: RE | Admit: 2022-06-21 | Discharge: 2022-06-21 | Disposition: A | Discharge status: Home / Self Care | Payer: Medicaid Other | Source: Ambulatory Visit | Attending: INTERNAL MEDICINE | Admitting: INTERNAL MEDICINE

## 2022-06-21 ENCOUNTER — Other Ambulatory Visit (RURAL_HEALTH_CENTER): Payer: Self-pay | Admitting: INTERNAL MEDICINE

## 2022-06-21 ENCOUNTER — Other Ambulatory Visit: Payer: Self-pay

## 2022-06-21 ENCOUNTER — Telehealth (RURAL_HEALTH_CENTER): Payer: Self-pay | Admitting: INTERNAL MEDICINE

## 2022-06-21 DIAGNOSIS — R0683 Snoring: Secondary | ICD-10-CM | POA: Insufficient documentation

## 2022-06-21 DIAGNOSIS — R400 Somnolence: Secondary | ICD-10-CM | POA: Insufficient documentation

## 2022-06-21 MED ORDER — EZETIMIBE 10 MG TABLET
10.0000 mg | ORAL_TABLET | Freq: Every evening | ORAL | 4 refills | Status: DC
Start: 2022-06-21 — End: 2024-03-05

## 2022-06-21 NOTE — Telephone Encounter (Signed)
-----   Message from Casimer Lanius, DO sent at 06/21/2022  8:29 AM EDT -----  Cholesterol is really high. Not as high as last time, but still really high.  I know she doesn't want to take medication, but that remains my suggestion  Otherwise everything about the same

## 2022-06-21 NOTE — Telephone Encounter (Signed)
Pt was notified of results

## 2022-06-25 NOTE — Procedures (Signed)
NAME:  [place] NUMBER:  [id number]  DATE OF SERVICE:  06/21/2022  DOB:  1965-06-17  SEX:  F      NAME OF STUDY:  Adult polysomnogram.    INDICATIONS FOR PROCEDURE:  A patient with multiple risk factors for obstructive sleep apnea, which do include a BMI of 45, neck size of 15.5, and Epworth score of 19.    DESCRIPTION OF STUDY:  This is a sleep study done according to the American Academy of Sleep Medicine guidelines.  Hypopnea was defined as a 4% desaturation with evidence for airflow limitation.    SLEEP SUMMARY:  The overall sleep efficiency was only 57%, sleep latency was 15.5 minutes.  REM sleep latency was 165.5 minutes and wakefulness after sleep onset was 194.5 minutes.    The stage distributions are as follows: Stage 1 sleep was 5.6%, stage 2 was 54%, stage 3 was 21.5%, and REM sleep was 18.9% or 52.5 minutes.    RESPIRATORY DATA:  The overall Apnea-Hypopnea Index throughout the test was 5 events per hour with the lowest oxygen desaturation accompanied to a respiratory event of 85%.  Average was 89% and the time below 88% was 0.7 minutes.  The saturation while awake was 97%.    HEART RATE SUMMARY:  The mean heart rate while awake was 77.2 beats per minute, during REM was 74.9 versus non-REM 71.7 beats per minute.    PERIODIC LIMB MOVEMENT SUMMARY:  The overall periodic movement index was 0.    IMPRESSION:  1. There is no evidence for obstructive sleep apnea based on apnea-hypopnea less than 5.  2. Significantly elevated Epworth score of 19.  3. Obesity stage III.    RECOMMENDATIONS:  Evaluation for other causes for excessive daytime sleepiness, they might include insomnia since the patient's efficiency was 57%.        Tsosie Billing, MD,FCCP,FACP,FAASM  Diplomate, American Board of Sleep Medicine    Fellow, [place] [employer]      CC:   [place], [place]  Fax: [phone]       DD:  06/24/2022 19:38:45  DT:  06/25/2022 10:58:28 BW  D#:  [id number]

## 2022-06-28 ENCOUNTER — Telehealth (RURAL_HEALTH_CENTER): Payer: Self-pay | Admitting: INTERNAL MEDICINE

## 2022-06-28 NOTE — Telephone Encounter (Signed)
Pt was notified of results.. she said i woke her up and didn't want to discuss her options right now. Told pt i would make note

## 2022-06-28 NOTE — Telephone Encounter (Signed)
-----   Message from Casimer Lanius, DO sent at 06/28/2022  9:16 AM EDT -----  No sleep apnea. Which is good  But definitely disturbed sleep.  The sleep doctor thinks its probably related to insomnia. We can try treating with insomnia medication if she wants, or we can try referring her to someone specifically trained in sleep.

## 2022-06-29 ENCOUNTER — Other Ambulatory Visit: Payer: Self-pay

## 2022-06-29 ENCOUNTER — Ambulatory Visit (HOSPITAL_COMMUNITY)
Admission: RE | Admit: 2022-06-29 | Discharge: 2022-06-29 | Disposition: A | Discharge status: Still In Hospital | Payer: Medicaid Other | Source: Ambulatory Visit | Attending: INTERNAL MEDICINE | Admitting: INTERNAL MEDICINE

## 2022-06-29 ENCOUNTER — Encounter (HOSPITAL_COMMUNITY): Payer: Self-pay

## 2022-06-29 DIAGNOSIS — M545 Low back pain, unspecified: Secondary | ICD-10-CM

## 2022-06-29 DIAGNOSIS — G8929 Other chronic pain: Secondary | ICD-10-CM | POA: Insufficient documentation

## 2022-06-29 NOTE — PT Evaluation (Addendum)
[facility]  Outpatient Physical Therapy  [place], [address]  [phone]  [phone]      Physical Therapy Lumbar Evaluation    Date: 06/29/2022  Patient's Name: Rhonda Gentry  Date of Birth: [birth year]  Physical Therapy Evaluation      PT diagnosis: chronic low back pain    Reason for Referral: Pt was hit by a pallet loaded forklift at work in June 2021 and has had LBP since. It goes from her mid back all the way to her low back. She has 6 stairs to enter and exit her home and they are challenging, but she can do them. She has the most difficulty with cooking, bending, lifting, and reaching OH.            SUBJECTIVE  Date of onset: June 2021    PMH: B knee OA,     Mechanism of injury: struck by forklift    Previous episodes/treatments: PT in 2021 (pt reports no improvements)    PLOF: IND    Medications for this problem:  not for pain    Patient goals: REDUCE PAIN and NORMALIZE FUNCTION    Occupation: UNEMPLOYED    Pain location: mid-back, low back                    Pain description: SHARP, ACHING, STABBING, and SHOOTING    Pain frequency:  CONTINUOUS    Pain rating: Now 6   Best 3   Worst 9    Radiculopathy: no, but she does have tremors of unknown origin    Pain increases with: POSITION CHANGE, ADLs, ACTIVITY, STAND, BENDING, and LIFTING           decreases with : HEAT and MASSAGE    Sensation: WFL    Weakness: none reported    Sleep affected: yes - wakes at night with pain    Bowel/bladder problems:none reported    Subjective Functional Reports:    Sitting: WFL    Standing: LIMITED    Walking: LIMITED    Lifting: Limited          OBJECTIVE    Patient-Specific Functional Score:    Problem Score   1. Doing dishes 3   2. Getting in and out of the car 3   3. Walking 30 min 0   Total 2       AROM   right left   Flexion WNL with pain    Extension 0 deg with pain    Sidebend Mid thigh Mid thigh   Rotation 30 deg 30 deg     ROM comments pain with lumbar  ext, rot, sidebending    Hip AROM WFL with tightness in B HS, Piriformis    Strength  B hips 4-/5 ext, flex, abduction  B scapular strength 4-/5     B knee ext 4-/5 with pain during knee ext     Palpation: TTP B thoracic and lumbar paraspinals    Joint mobility hypomobility noted with PA T4-S1    Posture: DECREASED LORDOSIS, INCREASED THORACIC KYPHOSIS, and FORWARD HEAD    Gait: USES ASSISTIVE DEVICE: SC (switches hands), wide BOS, trunk lean towards stance phase B    Special tests: SLR(-), slump (-),     Treatment provided:REVIEW OF POC AND GOALS WITH PATIENT, ALL QUESTIONS ANSWERED, PATIENT EDUCATION, and THERAPEUTIC EXERCISE (seated ball rollouts, MFR B thoracic and lumbar paraspinals)  ASSESSMENT    Impression: Pt presents with chronic mid and low back pain accompanied by decreased thoracic and lumbar mobility, decreased hip, core, and postural strength and endurance, and decreased B hip flexibility. Aforementioned impairments are interfering with her ability to stand, walk, lift, bend, dress, cook, and sleep. She will beenfit from skilled PT service to optimize lumbar and thoracic mobility, improve hip and core strength and endurance, and reduce pain to improve her ability to participate in ADLs and recreational activities.     Rehab potential: FAIR    Short Term Goals: 3Weeks   -Intermittent vs. constant pain. Worst SPS rating less than 5.   -Pt will be able to do dishes for 10 min without increasing pain in her back   -Pt will become IND and compliant with HEP to improve carryover of gains made with PT.       Long Term Goals: 6 Weeks   -Sleep not disrupted by back pain.   -Pt will improve B hip strength and scapular strength to at least 4+/5 to reduce stress and strain to the back with lifting and bending.    -Community ambulation not limited by back pain.    PLAN  Patient will attend 2 times per week x 6 weeks. Therapy may include, but is not limited to THERAPEUTIC EXERCISES, MYOFASCIAL/JOINT  MOBILIZATION, POSTURE/BODY MECHANICS, ERGONOMIC TRAINING, TRANSFER/GAIT TRAINING, HOME INSTRUCTIONS, HEAT/COLD, ELECTRICAL STIMULATION, KINESIOTAPE, MECHANICAL TRACTION, and NEURO RE-EDUCATIOIN    Plan for next visit Initiate with nuStep, progress table based and seated hip and core strength, lumbar and thoracic mobility, MFR in sitting to reduce pain     Evaluation complexity:   Personal factors impacting POC: FREQUENT OR CHRONIC PAIN   Co-morbidities impacting POC: OBESITY  Complexity of physical exam: INCLUDING MUSCULOSKELETAL SYSTEM (POSTURE, ROM, STRENGTH, HEIGHT/WEIGHT), INCLUDING NEUROMUSCULAR EXAM (BALANCE, GAIT, LOCOMOTION, MOBILITY), INCLUDING ACTIVITY/MOBILITY RESTRICTIONS, and REFERRAL IS FOR A CHRONIC PROBLEM   Clinical Presentation: STABLE   Evaluation Complexity: LOW-HISTORY 0, EXAMINATION 1-2, STABLE PRESENTATION      Total Session Time 45, Timed code minutes 10, and Untimed code minutes 35        Intervention minutes: EVALUATION 35 minutes and THERAPEUTIC EXERCISE 10 minutes    Jeanella Flattery, PT  06/29/2022, 11:02              Start of Service: _________          Certification:    From:______  Through:_________    I certify the need for these services furnished under this plan of treatment and while under my care.    Referring Provider Signature: _______________     Date : _____________________    Printed name of Referring Provider: ___________________________________________

## 2022-07-07 ENCOUNTER — Ambulatory Visit (HOSPITAL_COMMUNITY)
Admission: RE | Admit: 2022-07-07 | Discharge: 2022-07-07 | Disposition: A | Discharge status: Still In Hospital | Payer: Medicaid Other | Source: Ambulatory Visit

## 2022-07-07 ENCOUNTER — Other Ambulatory Visit: Payer: Self-pay

## 2022-07-07 NOTE — PT Treatment (Addendum)
[facility]  Outpatient Physical Therapy  [place], [address]  [phone]  [phone]    Physical Therapy Treatment Note    Date: 07/07/2022  Patient's Name: Rhonda Gentry  Date of Birth: [birth year]  Physical Therapy Visit      Visit #/POC: 2/12  Authorization:-  POC Signed?: yes  POC Ends: 08/25/2022  Next Progress Note Due: visit 10 or 4/19    Evaluating Physical Therapist: Jeanella Flattery, PT, DPT  PT diagnosis/Reason for Referral: chronic low back pain    Subjective: Pt reports no subjective change since IE    Objective: exercises per f/s    Measured ROM:  EXERCISE/ACTIVITY NAME REPETITIONS RESISTANCE COMPLETED THIS DOS   nuStep   8' L4 y   Seated HS stretch   2'/2' - y   Seated Piriformis stretch   2'/2' - y   Seated lumbar ball rollouts   3 x 10 - y   LTR   2x 10 - y   Bridges    2x10 - y   H/l hip abd   2 x 10 blue y   H/l hip add   2 x 10 5 sec hold y   H/l abdominal brace with mini march   3 x 10 - y       Assessment: Increased L knee pain with piriformis stretch, otherwise good tolerance to selective progression of exercises to improve hip and core strength as well as lumbar mobility.     Short Term Goals: 3Weeks              -Intermittent vs. constant pain. Worst SPS rating less than 5.              -Pt will be able to do dishes for 10 min without increasing pain in her back              -Pt will become IND and compliant with HEP to improve carryover of gains made with PT.      Long Term Goals: 6 Weeks              -Sleep not disrupted by back pain.              -Pt will improve B hip strength and scapular strength to at least 4+/5 to reduce stress and strain to the back with lifting and bending.               -Community ambulation not limited by back pain.    Plan: Progress standing hip strength as able. Continue per POC    Total Session Time 40 and Timed code minutes 40  THERAPEUTIC EXERCISE 40 minutes      Jeanella Flattery, PT   07/07/2022, 11:53

## 2022-07-09 ENCOUNTER — Other Ambulatory Visit: Payer: Self-pay

## 2022-07-09 ENCOUNTER — Ambulatory Visit (HOSPITAL_COMMUNITY)
Admission: RE | Admit: 2022-07-09 | Discharge: 2022-07-09 | Disposition: A | Discharge status: Still In Hospital | Payer: Medicaid Other | Source: Ambulatory Visit

## 2022-07-09 NOTE — PT Treatment (Signed)
[facility]  Outpatient Physical Therapy  [place], [address]  [phone]  [phone]    Physical Therapy Treatment Note    Date: 07/09/2022  Patient's Name: Rhonda Gentry  Date of Birth: April 04, 1966  Physical Therapy Visit    Visit #/POC: 3/12  Authorization:-  POC Signed?: yes  POC Ends: 08/25/2022  Next Progress Note Due: visit 10 or 4/19     Evaluating Physical Therapist: Jeanella Flattery, PT, DPT  PT diagnosis/Reason for Referral: chronic low back pain     Subjective: Pt reports mild soreness for a few hours after last treatment. She still has difficulty putting on shoes and socks.     Objective: exercises per f/s     Measured ROM:  EXERCISE/ACTIVITY NAME REPETITIONS RESISTANCE COMPLETED THIS DOS   nuStep    8' L4 y   Seated HS stretch    2'/2' - y   Manual Piriformis stretch    2'/2' - y   Seated lumbar ball rollouts    3 x 10 - y   LTR    2x 10 - y   Bridges     2x10 - y   H/l hip abd    2 x 10 blue y   H/l hip add    2 x 10 5 sec hold y   H/l abdominal brace with mini march    3 x 10 - y         Assessment: No increased pain with activities today. She did have some R groin pulling with R lumbar rotation during LTR.      Short Term Goals: 3Weeks              -Intermittent vs. constant pain. Worst SPS rating less than 5.              -Pt will be able to do dishes for 10 min without increasing pain in her back              -Pt will become IND and compliant with HEP to improve carryover of gains made with PT.      Long Term Goals: 6 Weeks              -Sleep not disrupted by back pain.              -Pt will improve B hip strength and scapular strength to at least 4+/5 to reduce stress and strain to the back with lifting and bending.               -Community ambulation not limited by back pain.     Plan: Progress standing hip strength as able. Continue per POC    Total Session Time 40 and Timed code minutes 40  THERAPEUTIC EXERCISE 40  minutes      Jeanella Flattery, PT  07/09/2022, 09:14

## 2022-07-14 ENCOUNTER — Other Ambulatory Visit: Payer: Self-pay

## 2022-07-14 ENCOUNTER — Ambulatory Visit
Admission: RE | Admit: 2022-07-14 | Discharge: 2022-07-14 | Disposition: A | Discharge status: Still In Hospital | Payer: Medicaid Other | Source: Ambulatory Visit | Attending: INTERNAL MEDICINE | Admitting: INTERNAL MEDICINE

## 2022-07-14 NOTE — PT Treatment (Addendum)
[facility]  Outpatient Physical Therapy  [address]  [place], [address]  [phone]  (Fax) [phone]    Physical Therapy Treatment Note/Discharge Summary    Date: 07/14/2022  Patient's Name: Rhonda Gentry  Date of Birth: 1965-09-08  Physical Therapy Discharge      **Discharge Summary: Pt calls to cancel all OP PT visits at this time as she has recently been hospitalized with B PE's and she is unable to participate in OP physical therapy at this time. She was encouraged to call back with any further needs or concerns. Shirlean Schlein, PT 07/20/2022 12:22      Visit #/POC: 3/12  Authorization:-  POC Signed?: yes  POC Ends: 08/25/2022  Next Progress Note Due: visit 10 or 4/19     Evaluating Physical Therapist: Shirlean Schlein, PT, DPT  PT diagnosis/Reason for Referral: chronic low back pain     Subjective: Pt was able to get on and off the ground over the weekend but needed to hold onto furniture to do so.     Objective: exercises per f/s     Measured ROM:  EXERCISE/ACTIVITY NAME REPETITIONS RESISTANCE COMPLETED THIS DOS   nuStep    8' L4 y   Seated HS stretch    2'/2' - y   Manual Piriformis stretch    2'/2' - y   Seated lumbar ball rollouts    3 x 10 - y   LTR    2x 10 - y   Bridges     2x10 - y   H/l hip abd    2 x 10 blue y   H/l hip add    2 x 10 5 sec hold y   H/l abdominal brace with mini march    3 x 10 - y   SLR 2 x 10 - y   STS 2 x 10 - y         Assessment: Mild discomfort in L hernia with L SLR today. Able to progress hip strength with STS without exacerbating back or knee pain.  Short Term Goals: 3 Weeks              -Intermittent vs. constant pain. Worst SPS rating less than 5.              -Pt will be able to do dishes for 10 min without increasing pain in her back              -Pt will become IND and compliant with HEP to improve carryover of gains made with PT.      Long Term Goals: 6 Weeks              -Sleep not disrupted by back pain.               -Pt will improve B hip strength and scapular strength to at least 4+/5 to reduce stress and strain to the back with lifting and bending.               -Community ambulation not limited by back pain.     Plan: Progress standing hip strength as able. Continue per POC    Total Session Time 43 and Timed code minutes 43  THERAPEUTIC EXERCISE 43 minutes      Shirlean Schlein, PT  07/14/2022, 12:16

## 2022-07-16 ENCOUNTER — Emergency Department
Admission: EM | Admit: 2022-07-16 | Discharge: 2022-07-16 | Disposition: A | Discharge status: Other Acute Inpatient Hospital | Payer: Medicaid Other | Source: Skilled Nursing Facility | Attending: Family | Admitting: Family

## 2022-07-16 ENCOUNTER — Ambulatory Visit (HOSPITAL_COMMUNITY): Payer: Self-pay

## 2022-07-16 ENCOUNTER — Emergency Department (HOSPITAL_BASED_OUTPATIENT_CLINIC_OR_DEPARTMENT_OTHER): Payer: Medicaid Other

## 2022-07-16 ENCOUNTER — Emergency Department (EMERGENCY_DEPARTMENT_HOSPITAL): Payer: Medicaid Other

## 2022-07-16 ENCOUNTER — Encounter (HOSPITAL_BASED_OUTPATIENT_CLINIC_OR_DEPARTMENT_OTHER): Payer: Self-pay

## 2022-07-16 ENCOUNTER — Other Ambulatory Visit: Payer: Self-pay

## 2022-07-16 DIAGNOSIS — R9431 Abnormal electrocardiogram [ECG] [EKG]: Secondary | ICD-10-CM | POA: Insufficient documentation

## 2022-07-16 DIAGNOSIS — Z1152 Encounter for screening for COVID-19: Secondary | ICD-10-CM | POA: Insufficient documentation

## 2022-07-16 DIAGNOSIS — I2699 Other pulmonary embolism without acute cor pulmonale: Secondary | ICD-10-CM | POA: Insufficient documentation

## 2022-07-16 LAB — COMPREHENSIVE METABOLIC PANEL, NON-FASTING
ALBUMIN/GLOBULIN RATIO: 0.8 (ref 0.8–1.4)
ALBUMIN: 3.4 g/dL (ref 3.4–5.0)
ALKALINE PHOSPHATASE: 92 U/L (ref 46–116)
ALT (SGPT): 15 U/L (ref <=78)
ANION GAP: 12 mmol/L (ref 4–13)
AST (SGOT): 17 U/L (ref 15–37)
BILIRUBIN TOTAL: 0.6 mg/dL (ref 0.2–1.0)
BUN/CREA RATIO: 8
BUN: 9 mg/dL (ref 7–18)
CALCIUM, CORRECTED: 9.6 mg/dL
CALCIUM: 9.1 mg/dL (ref 8.5–10.1)
CHLORIDE: 102 mmol/L (ref 98–107)
CO2 TOTAL: 25 mmol/L (ref 21–32)
CREATININE: 1.13 mg/dL — ABNORMAL HIGH (ref 0.55–1.02)
ESTIMATED GFR: 57 mL/min/{1.73_m2} — ABNORMAL LOW (ref >59)
GLOBULIN: 4.3
GLUCOSE: 101 mg/dL (ref 74–106)
OSMOLALITY, CALCULATED: 276 mOsm/kg (ref 270–290)
POTASSIUM: 3.1 mmol/L — ABNORMAL LOW (ref 3.5–5.1)
PROTEIN TOTAL: 7.7 g/dL (ref 6.4–8.2)
SODIUM: 139 mmol/L (ref 136–145)

## 2022-07-16 LAB — CBC WITH DIFF
BASOPHIL #: 0.02 10*3/uL (ref 0.00–0.30)
BASOPHIL %: 0 % (ref 0–3)
EOSINOPHIL #: 0.08 10*3/uL (ref 0.00–0.80)
EOSINOPHIL %: 1 % (ref 0–7)
HCT: 38.6 % (ref 37.0–47.0)
HGB: 12.6 g/dL (ref 12.5–16.0)
LYMPHOCYTE #: 2.01 10*3/uL (ref 1.10–5.00)
LYMPHOCYTE %: 20 % — ABNORMAL LOW (ref 25–45)
MCH: 27.1 pg (ref 27.0–32.0)
MCHC: 32.7 g/dL (ref 32.0–36.0)
MCV: 82.8 fL (ref 78.0–99.0)
MONOCYTE #: 0.67 10*3/uL (ref 0.00–1.30)
MONOCYTE %: 7 % (ref 0–12)
MPV: 7.3 fL — ABNORMAL LOW (ref 7.4–10.4)
NEUTROPHIL #: 7.09 10*3/uL (ref 1.80–8.40)
NEUTROPHIL %: 72 % (ref 40–76)
PLATELETS: 366 10*3/uL (ref 140–440)
RBC: 4.66 10*6/uL (ref 4.20–5.40)
RDW: 17.3 % — ABNORMAL HIGH (ref 11.6–14.8)
WBC: 9.9 10*3/uL (ref 4.0–10.5)

## 2022-07-16 LAB — PT/INR
INR: 1.31 — ABNORMAL HIGH (ref 0.88–1.10)
PROTHROMBIN TIME: 15.1 seconds — ABNORMAL HIGH (ref 9.8–12.7)

## 2022-07-16 LAB — COVID-19, FLU A/B, RSV RAPID BY PCR
INFLUENZA VIRUS TYPE A: NOT DETECTED
INFLUENZA VIRUS TYPE B: NOT DETECTED
RESPIRATORY SYNCTIAL VIRUS (RSV): NOT DETECTED
SARS-CoV-2: NOT DETECTED

## 2022-07-16 LAB — ARTERIAL BLOOD GAS/LACTATE
%FIO2 (ARTERIAL): 21 %
BASE EXCESS (ARTERIAL): 0.3 mmol/L (ref 0.0–2.0)
BICARBONATE (ARTERIAL): 22.7 mmol/L (ref 20.0–26.0)
CARBOXYHEMOGLOBIN: 1 % (ref <=1.5)
LACTATE: 1.2 mmol/L (ref <=2.0)
MET-HEMOGLOBIN: 0.1 % (ref <=2.0)
O2CT: 17 %
OXYHEMOGLOBIN: 88.4 % (ref 88.0–100.0)
PCO2 (ARTERIAL): 30 mm/Hg — ABNORMAL LOW (ref 35–45)
PH (ARTERIAL): 7.49 — ABNORMAL HIGH (ref 7.35–7.45)
PO2 (ARTERIAL): 52 mm/Hg — ABNORMAL LOW (ref 80–100)

## 2022-07-16 LAB — TROPONIN-I
TROPONIN I: 345 ng/L — ABNORMAL HIGH (ref <15)
TROPONIN I: 26 ng/L — ABNORMAL HIGH (ref <15)
TROPONIN I: 401 ng/L — ABNORMAL HIGH (ref <15)

## 2022-07-16 LAB — LACTIC ACID LEVEL W/ REFLEX FOR LEVEL >2.0: LACTIC ACID: 1.9 mmol/L (ref 0.4–2.0)

## 2022-07-16 LAB — PTT (PARTIAL THROMBOPLASTIN TIME): APTT: 37.1 seconds — ABNORMAL HIGH (ref 22.0–31.7)

## 2022-07-16 LAB — NT-PROBNP: NT-PROBNP: 2521 pg/mL — ABNORMAL HIGH (ref <=125)

## 2022-07-16 MED ORDER — HEPARIN (PORCINE) 5,000 UNIT/ML INJECTION SOLUTION
INTRAMUSCULAR | Status: AC
Start: 2022-07-16 — End: 2022-07-16
  Filled 2022-07-16: qty 2

## 2022-07-16 MED ORDER — POTASSIUM CHLORIDE ER 20 MEQ TABLET,EXTENDED RELEASE(PART/CRYST)
40.0000 meq | ORAL_TABLET | ORAL | Status: AC
Start: 2022-07-16 — End: 2022-07-16
  Administered 2022-07-16: 40 meq via ORAL

## 2022-07-16 MED ORDER — ASPIRIN 81 MG CHEWABLE TABLET
CHEWABLE_TABLET | ORAL | Status: AC
Start: 2022-07-16 — End: 2022-07-16
  Filled 2022-07-16: qty 4

## 2022-07-16 MED ORDER — HEPARIN (PORCINE) 25,000 UNIT/250 ML (100 UNIT/ML) IN DEXTROSE 5 % IV
INTRAVENOUS | Status: AC
Start: 2022-07-16 — End: 2022-07-16
  Filled 2022-07-16: qty 250

## 2022-07-16 MED ORDER — METHYLPREDNISOLONE SOD SUCC 125 MG SOLUTION FOR INJECTION WRAPPER
125.0000 mg | INTRAVENOUS | Status: AC
Start: 2022-07-16 — End: 2022-07-16
  Administered 2022-07-16: 125 mg via INTRAMUSCULAR

## 2022-07-16 MED ORDER — SODIUM CHLORIDE 0.9 % IV BOLUS
1000.0000 mL | INJECTION | Status: AC
Start: 2022-07-16 — End: 2022-07-16
  Administered 2022-07-16: 1000 mL via INTRAVENOUS
  Administered 2022-07-16: 0 mL via INTRAVENOUS

## 2022-07-16 MED ORDER — IOHEXOL 350 MG IODINE/ML INTRAVENOUS SOLUTION
100.0000 mL | INTRAVENOUS | Status: AC
Start: 2022-07-16 — End: 2022-07-16
  Administered 2022-07-16: 100 mL via INTRAVENOUS

## 2022-07-16 MED ORDER — ASPIRIN 81 MG CHEWABLE TABLET
324.0000 mg | CHEWABLE_TABLET | ORAL | Status: AC
Start: 2022-07-16 — End: 2022-07-16
  Administered 2022-07-16: 324 mg via ORAL

## 2022-07-16 MED ORDER — HEPARIN (PORCINE) 25,000 UNIT/250 ML (100 UNIT/ML) IN DEXTROSE 5 % IV
1500.0000 [IU]/h | INTRAVENOUS | Status: DC
Start: 2022-07-16 — End: 2022-07-16
  Administered 2022-07-16: 1500 [IU]/h via INTRAVENOUS

## 2022-07-16 MED ORDER — HEPARIN (PORCINE) 5,000 UNITS/ML BOLUS
80.0000 [IU]/kg | Freq: Once | INTRAMUSCULAR | Status: AC
Start: 2022-07-16 — End: 2022-07-16
  Administered 2022-07-16: 6500 [IU] via INTRAVENOUS

## 2022-07-16 MED ORDER — IPRATROPIUM 0.5 MG-ALBUTEROL 3 MG (2.5 MG BASE)/3 ML NEBULIZATION SOLN
INHALATION_SOLUTION | RESPIRATORY_TRACT | Status: AC
Start: 2022-07-16 — End: 2022-07-16
  Filled 2022-07-16: qty 3

## 2022-07-16 MED ORDER — IPRATROPIUM 0.5 MG-ALBUTEROL 3 MG (2.5 MG BASE)/3 ML NEBULIZATION SOLN
3.0000 mL | INHALATION_SOLUTION | RESPIRATORY_TRACT | Status: AC
Start: 2022-07-16 — End: 2022-07-16
  Administered 2022-07-16: 3 mL via RESPIRATORY_TRACT

## 2022-07-16 MED ORDER — METHYLPREDNISOLONE SOD SUCC 125 MG SOLUTION FOR INJECTION WRAPPER
INTRAVENOUS | Status: AC
Start: 2022-07-16 — End: 2022-07-16
  Filled 2022-07-16: qty 2

## 2022-07-16 MED ORDER — POTASSIUM CHLORIDE ER 20 MEQ TABLET,EXTENDED RELEASE(PART/CRYST)
ORAL_TABLET | ORAL | Status: AC
Start: 2022-07-16 — End: 2022-07-16
  Filled 2022-07-16: qty 2

## 2022-07-16 NOTE — ED Nurses Note (Signed)
[facility] here to transport patient to [facility]. Patient taking personal belongings with her during transport, including black purse, clothing, shoes, cell phone, cane and home medications. Patient A&Ox4.

## 2022-07-16 NOTE — ED Nurses Note (Signed)
Placed on 02 2 liters nasal cannula.

## 2022-07-16 NOTE — ED Nurses Note (Signed)
Provided patient with meal tray and po fluids. Denies any complaints of SOB or pain. Patient remains 2L O2 NC, sitting up in bed. No signs of acute distress noted.

## 2022-07-16 NOTE — ED Nurses Note (Signed)
Patient notified of being accepted to [facility]. Pt verbalized understanding with no additional questions.

## 2022-07-16 NOTE — ED Triage Notes (Signed)
Pt complains of being short of breath since yesterday.

## 2022-07-16 NOTE — ED Nurses Note (Signed)
Called report to Charmian Muff, RN at [facility] ED. Verbalized understanding with no additional concerns.

## 2022-07-16 NOTE — ED Nurses Note (Signed)
[facility] called to transport at this time.

## 2022-07-16 NOTE — ED Provider Notes (Signed)
[facility], [facility]  ED Primary Provider Note  History of Present Illness   Chief Complaint   Patient presents with    Shortness of Breath     Rhonda Gentry is a 57 y.o. female who had concerns including Shortness of Breath.  Arrival: The patient arrived by Ambulance    Patient is a 57 year old female to the emergency department complaining of increased shortness of breath that has been ongoing for the past 2 days but worsening today.  Patient has a history of asthma and been taking Advair at home with no symptom relief.  Patient additionally complains of dry cough without fever, hemoptysis, productive cough.  Patient denies any chest pain, but does states it is difficult to take a deep breath.  Patient denies pain with inhalation.  Patient denies any recent prolonged immobilization, travel, surgery, hemoptysis or chest pain.      History Reviewed This Encounter: Medical History  Surgical History  Family History  Social History    Physical Exam   ED Triage Vitals [07/16/22 1227]   BP (Non-Invasive) 133/85   Heart Rate (!) 101   Respiratory Rate (!) 22   Temperature 36.2 C (97.1 F)   SpO2 98 %   Weight 113 kg (250 lb)   Height 1.727 m (5\' 8" )     Physical Exam  Vitals and nursing note reviewed.   Constitutional:       General: She is not in acute distress.     Appearance: She is well-developed.   HENT:      Head: Normocephalic and atraumatic.   Eyes:      Conjunctiva/sclera: Conjunctivae normal.   Cardiovascular:      Rate and Rhythm: Normal rate and regular rhythm.      Heart sounds: No murmur heard.  Pulmonary:      Effort: Pulmonary effort is normal. No respiratory distress.      Breath sounds: Normal breath sounds.   Abdominal:      Palpations: Abdomen is soft.      Tenderness: There is no abdominal tenderness.   Musculoskeletal:         General: No swelling.      Cervical back: Neck supple.   Skin:     General: Skin is warm and dry.      Capillary  Refill: Capillary refill takes less than 2 seconds.   Neurological:      Mental Status: She is alert.   Psychiatric:         Mood and Affect: Mood normal.       Patient Data     Labs Ordered/Reviewed   ARTERIAL BLOOD GAS/LACTATE - Abnormal; Notable for the following components:       Result Value    PH (ARTERIAL) 7.49 (*)     PCO2 (ARTERIAL) 30 (*)     PO2 (ARTERIAL) 52 (*)     All other components within normal limits   NT-PROBNP - Abnormal; Notable for the following components:    NT-PROBNP 2,521 (*)     All other components within normal limits   COMPREHENSIVE METABOLIC PANEL, NON-FASTING - Abnormal; Notable for the following components:    POTASSIUM 3.1 (*)     CREATININE 1.13 (*)     ESTIMATED GFR 57 (*)     All other components within normal limits    Narrative:     Estimated Glomerular Filtration Rate (eGFR) is calculated using the CKD-EPI (2021) equation, intended  for patients 58 years of age and older. If gender is not documented or "unknown", there will be no eGFR calculation.   PT/INR - Abnormal; Notable for the following components:    PROTHROMBIN TIME 15.1 (*)     INR 1.31 (*)     All other components within normal limits    Narrative:     INR OF 2.0-3.0  RECOMMENDED FOR: PROPHYLAXIS/TREATMENT OF VENEOUS THROMBOSIS, PULMONARY EMBOLISM, PREVENTION OF SYSTEMIC EMBOLISM FROM ATRIAL FIBRILATION, MYOCARDIAL INFARCTION.    INR OF 2.5-3.5  RECOMMENDED FOR MECHANICAL PROSTHETIC HEART VALVES, RECURRENT SYSTEMIC EMBOLISM, RECURRENT MYOCARDIAL INFARCTION.     PTT (PARTIAL THROMBOPLASTIN TIME) - Abnormal; Notable for the following components:    APTT 37.1 (*)     All other components within normal limits   TROPONIN-I - Abnormal; Notable for the following components:    TROPONIN I 345 (*)     All other components within normal limits    Narrative:     Values received on females ranging between 12-15 ng/L MUST include the next serial troponin to review changes in the delta differences as the reference range for the  Access II chemistry analyzer is lower than the established reference range.     CBC WITH DIFF - Abnormal; Notable for the following components:    RDW 17.3 (*)     MPV 7.3 (*)     LYMPHOCYTE % 20 (*)     All other components within normal limits   TROPONIN-I - Abnormal; Notable for the following components:    TROPONIN I 26 (*)     All other components within normal limits    Narrative:     Values received on females ranging between 12-15 ng/L MUST include the next serial troponin to review changes in the delta differences as the reference range for the Access II chemistry analyzer is lower than the established reference range.     TROPONIN-I - Abnormal; Notable for the following components:    TROPONIN I 401 (*)     All other components within normal limits    Narrative:     Values received on females ranging between 12-15 ng/L MUST include the next serial troponin to review changes in the delta differences as the reference range for the Access II chemistry analyzer is lower than the established reference range.     LACTIC ACID LEVEL W/ REFLEX FOR LEVEL >2.0 - Normal   COVID-19, FLU A/B, RSV RAPID BY PCR - Normal    Narrative:     Results are for the simultaneous qualitative identification of SARS-CoV-2 (formerly 2019-nCoV), Influenza A, Influenza B, and RSV RNA. These etiologic agents are generally detectable in nasopharyngeal and nasal swabs during the ACUTE PHASE of infection. Hence, this test is intended to be performed on respiratory specimens collected from individuals with signs and symptoms of upper respiratory tract infection who meet Centers for Disease Control and Prevention (CDC) clinical and/or epidemiological criteria for Coronavirus Disease 2019 (COVID-19) testing. CDC COVID-19 criteria for testing on human specimens is available at [facility] webpage information for Healthcare Professionals: Coronavirus Disease 2019 (COVID-19) ([employer]com.auhttps://www.cdc.gov/coronavirus/2019-ncov/hcp/index.html).     False-negative  results may occur if the virus has genomic mutations, insertions, deletions, or rearrangements or if performed very early in the course of illness. Otherwise, negative results indicate virus specific RNA targets are not detected, however negative results do not preclude SARS-CoV-2 infection/COVID-19, Influenza, or Respiratory syncytial virus infection. Results should not be used as the sole basis for patient management decisions.  Negative results must be combined with clinical observations, patient history, and epidemiological information. If upper respiratory tract infection is still suspected based on exposure history together with other clinical findings, re-testing should be considered.    Disclaimer:   This assay has been authorized by FDA under an Emergency Use Authorization for use in laboratories certified under the Clinical Laboratory Improvement Amendments of 1988 (CLIA), 42 U.S.C. [phone], to perform high complexity tests. The impacts of vaccines, antiviral therapeutics, antibiotics, chemotherapeutic or immunosuppressant drugs have not been evaluated.     Test methodology:   Cepheid Xpert Xpress SARS-CoV-2/Flu/RSV Assay real-time polymerase chain reaction (RT-PCR) test on the GeneXpert Dx and Xpert Xpress systems.   CBC/DIFF    Narrative:     The following orders were created for panel order CBC/DIFF.  Procedure                               Abnormality         Status                     ---------                               -----------         ------                     CBC WITH [id number]]                Abnormal            Final result                 Please view results for these tests on the individual orders.     CT ANGIO CHEST W IV CONTRAST   Preliminary Result by Edi, Radresults In (04/05 1453)      This study is positive for pulmonary emboli predominantly involving first and more distal branches of the right and left pulmonary arteries. Clot burden overall is moderate. Clot burden is more  prominent inferiorly.      There is some straightening of interventricular septum and RV over LV ratio was greater than 1 suggesting that there is right heart strain.      No acute infiltrates or significant parenchymal abnormality is seen.      There are probable cysts in the liver, those large enough to measure accurately water density. These are about the same as the previous study.      Note: Called to [facility] ER and discussed with C. Ricquel Foulk FNP-BC by WLA at time of this region dictation.         One or more dose reduction techniques were used (e.g., Automated exposure control, adjustment of the mA and/or kV according to patient size, use of iterative reconstruction technique).      A Critical Document Only message has been documented for Marinda Tyer in the [employer] Actionable Findings system on 07/16/2022 2:53 PM, Message ID [id number].            Radiologist location ID: [id number]         XR AP MOBILE CHEST   Final Result by Edi, Radresults In (04/05 1312)   NEGATIVE CHEST            Radiologist location ID: [id number]  Medical Decision Making        Medical Decision Making  Patient is a 57 year old female to the emergency department complaining of increased shortness of breath that has been ongoing for the past 2 days but worsening today.  Patient states increase use of albuterol inhaler at home with no relief.  Differential diagnosis include asthma exacerbation, pulmonary embolism, pneumonia, COVID flu RSV.  On physical examination lung fields show mild wheezing in upper lobes but otherwise clear.  Patient increased oxygen requirement of 2 L nasal cannula to maintain oxygen saturation over 89%.  COVID flu RSV within normal limits.  CTA ordered after negative chest x-ray and shows positive for pulmonary embolism with likely right heart strain.  EKGs normal sinus rhythm.  Initial troponin greater than 300 with 2nd of 26.  Patient denied any chest pain.  Patient to be transferred to [facility] ED as  there are no beds available at [facility] on heparin drip after heparin bolus for pulmonary embolism.    Amount and/or Complexity of Data Reviewed  Labs: ordered.  Radiology: ordered.  ECG/medicine tests: ordered and independent interpretation performed.    Risk  OTC drugs.  Prescription drug management.  Decision regarding hospitalization.      ED Course as of 07/16/22 2138   Fri Jul 16, 2022   1337 TROPONIN-I(!): 345   1337 PH(!): 7.49         Medications Administered in the ED   ipratropium-albuterol 0.5 mg-3 mg(2.5 mg base)/3 mL Solution for Nebulization (3 mL Nebulization Given 07/16/22 1256)   methylPREDNISolone sod succ (SOLU-medrol) 125 mg/2 mL injection (125 mg IntraMUSCULAR Given 07/16/22 1308)   NS bolus infusion 1,000 mL (0 mL Intravenous Stopped 07/16/22 1408)   potassium chloride (K-DUR) extended release tablet (40 mEq Oral Given 07/16/22 1354)   aspirin chewable tablet 324 mg (324 mg Oral Given 07/16/22 1354)   iohexol (OMNIPAQUE 350) infusion (100 mL Intravenous Given 07/16/22 1433)     Clinical Impression   Pulmonary embolism, unspecified chronicity, unspecified pulmonary embolism type, unspecified whether acute cor pulmonale present (CMS HCC) (Primary)       Disposition: Transfered to Another Facility

## 2022-07-16 NOTE — ED Nurses Note (Addendum)
Patient c/o SOB x 2 days more severe today. Stated took rescue inhaler PTA to ED via EMS with no relief. Stated hx asthma with bronchitis. Denies CP, NV, fever. Patient tachypneic with increased WOB. Patient placed in bed on cardiac monitor, on RA. Denies use of O2 at home.

## 2022-07-18 ENCOUNTER — Emergency Department
Admission: EM | Admit: 2022-07-18 | Discharge: 2022-07-18 | Disposition: A | Discharge status: Home / Self Care | Payer: Medicaid Other | Attending: Emergency Medicine | Admitting: Emergency Medicine

## 2022-07-18 ENCOUNTER — Other Ambulatory Visit: Payer: Self-pay

## 2022-07-18 ENCOUNTER — Encounter (HOSPITAL_BASED_OUTPATIENT_CLINIC_OR_DEPARTMENT_OTHER): Payer: Self-pay | Admitting: Family

## 2022-07-18 ENCOUNTER — Emergency Department (HOSPITAL_BASED_OUTPATIENT_CLINIC_OR_DEPARTMENT_OTHER): Payer: Medicaid Other

## 2022-07-18 DIAGNOSIS — Z7901 Long term (current) use of anticoagulants: Secondary | ICD-10-CM | POA: Insufficient documentation

## 2022-07-18 DIAGNOSIS — I510 Cardiac septal defect, acquired: Secondary | ICD-10-CM

## 2022-07-18 DIAGNOSIS — I2699 Other pulmonary embolism without acute cor pulmonale: Secondary | ICD-10-CM | POA: Insufficient documentation

## 2022-07-18 DIAGNOSIS — I252 Old myocardial infarction: Secondary | ICD-10-CM | POA: Insufficient documentation

## 2022-07-18 DIAGNOSIS — I214 Non-ST elevation (NSTEMI) myocardial infarction: Secondary | ICD-10-CM

## 2022-07-18 DIAGNOSIS — J45909 Unspecified asthma, uncomplicated: Secondary | ICD-10-CM | POA: Insufficient documentation

## 2022-07-18 DIAGNOSIS — R9431 Abnormal electrocardiogram [ECG] [EKG]: Secondary | ICD-10-CM | POA: Insufficient documentation

## 2022-07-18 LAB — ARTERIAL BLOOD GAS/LACTATE
%FIO2 (ARTERIAL): 21 %
BASE EXCESS (ARTERIAL): 1.3 mmol/L (ref 0.0–2.0)
BICARBONATE (ARTERIAL): 23.8 mmol/L (ref 20.0–26.0)
CARBOXYHEMOGLOBIN: 0.9 % (ref <=1.5)
LACTATE: 0.9 mmol/L (ref <=2.0)
MET-HEMOGLOBIN: 0.1 % (ref <=2.0)
O2CT: 15.2 %
OXYHEMOGLOBIN: 91.8 % (ref 88.0–100.0)
PCO2 (ARTERIAL): 30 mm/Hg — ABNORMAL LOW (ref 35–45)
PH (ARTERIAL): 7.5 — ABNORMAL HIGH (ref 7.35–7.45)
PO2 (ARTERIAL): 59 mm/Hg — ABNORMAL LOW (ref 80–100)

## 2022-07-18 LAB — COMPREHENSIVE METABOLIC PANEL, NON-FASTING
ALBUMIN/GLOBULIN RATIO: 0.8 (ref 0.8–1.4)
ALBUMIN: 3.6 g/dL (ref 3.4–5.0)
ALKALINE PHOSPHATASE: 93 U/L (ref 46–116)
ALT (SGPT): 27 U/L (ref <=78)
ANION GAP: 12 mmol/L (ref 4–13)
AST (SGOT): 22 U/L (ref 15–37)
BILIRUBIN TOTAL: 0.4 mg/dL (ref 0.2–1.0)
BUN/CREA RATIO: 14
BUN: 15 mg/dL (ref 7–18)
CALCIUM, CORRECTED: 9.6 mg/dL
CALCIUM: 9.3 mg/dL (ref 8.5–10.1)
CHLORIDE: 102 mmol/L (ref 98–107)
CO2 TOTAL: 26 mmol/L (ref 21–32)
CREATININE: 1.1 mg/dL — ABNORMAL HIGH (ref 0.55–1.02)
ESTIMATED GFR: 59 mL/min/{1.73_m2} — ABNORMAL LOW (ref >59)
GLOBULIN: 4.4
GLUCOSE: 77 mg/dL (ref 74–106)
OSMOLALITY, CALCULATED: 279 mOsm/kg (ref 270–290)
POTASSIUM: 3.3 mmol/L — ABNORMAL LOW (ref 3.5–5.1)
PROTEIN TOTAL: 8 g/dL (ref 6.4–8.2)
SODIUM: 140 mmol/L (ref 136–145)

## 2022-07-18 LAB — CBC WITH DIFF
BASOPHIL #: 0.05 10*3/uL (ref 0.00–0.30)
BASOPHIL %: 0 % (ref 0–3)
EOSINOPHIL #: 0.09 10*3/uL (ref 0.00–0.80)
EOSINOPHIL %: 1 % (ref 0–7)
HCT: 38.8 % (ref 37.0–47.0)
HGB: 12.5 g/dL (ref 12.5–16.0)
LYMPHOCYTE #: 3.77 10*3/uL (ref 1.10–5.00)
LYMPHOCYTE %: 25 % (ref 25–45)
MCH: 27.1 pg (ref 27.0–32.0)
MCHC: 32.3 g/dL (ref 32.0–36.0)
MCV: 84 fL (ref 78.0–99.0)
MONOCYTE #: 1 10*3/uL (ref 0.00–1.30)
MONOCYTE %: 7 % (ref 0–12)
MPV: 7.7 fL (ref 7.4–10.4)
NEUTROPHIL #: 10.26 10*3/uL — ABNORMAL HIGH (ref 1.80–8.40)
NEUTROPHIL %: 68 % (ref 40–76)
PLATELETS: 409 10*3/uL (ref 140–440)
RBC: 4.62 10*6/uL (ref 4.20–5.40)
RDW: 18 % — ABNORMAL HIGH (ref 11.6–14.8)
WBC: 15.2 10*3/uL — ABNORMAL HIGH (ref 4.0–10.5)

## 2022-07-18 LAB — PT/INR
INR: 1.24 — ABNORMAL HIGH (ref 0.88–1.10)
PROTHROMBIN TIME: 14.4 seconds — ABNORMAL HIGH (ref 9.8–12.7)

## 2022-07-18 LAB — NT-PROBNP: NT-PROBNP: 1484 pg/mL — ABNORMAL HIGH (ref <=125)

## 2022-07-18 LAB — TROPONIN-I
TROPONIN I: 62 ng/L — ABNORMAL HIGH (ref <15)
TROPONIN I: 62 ng/L — ABNORMAL HIGH (ref <15)
TROPONIN I: 62 ng/L — ABNORMAL HIGH (ref <15)

## 2022-07-18 LAB — PTT (PARTIAL THROMBOPLASTIN TIME): APTT: 30.4 seconds (ref 22.0–31.7)

## 2022-07-18 MED ORDER — HEPARIN (PORCINE) 5,000 UNIT/ML INJECTION SOLUTION
INTRAMUSCULAR | Status: AC
Start: 2022-07-18 — End: 2022-07-18
  Filled 2022-07-18: qty 1

## 2022-07-18 MED ORDER — IOHEXOL 350 MG IODINE/ML INTRAVENOUS SOLUTION
100.0000 mL | INTRAVENOUS | Status: AC
Start: 2022-07-18 — End: 2022-07-18
  Administered 2022-07-18: 100 mL via INTRAVENOUS

## 2022-07-18 MED ORDER — APIXABAN 5 MG TABLET
ORAL_TABLET | ORAL | 0 refills | Status: DC
Start: 2022-07-18 — End: 2022-07-27

## 2022-07-18 MED ORDER — POTASSIUM CHLORIDE ER 20 MEQ TABLET,EXTENDED RELEASE(PART/CRYST)
40.0000 meq | ORAL_TABLET | ORAL | Status: AC
Start: 2022-07-18 — End: 2022-07-18
  Administered 2022-07-18: 40 meq via ORAL

## 2022-07-18 MED ORDER — HEPARIN (PORCINE) 25,000 UNIT/250 ML (100 UNIT/ML) IN DEXTROSE 5 % IV
INTRAVENOUS | Status: AC
Start: 2022-07-18 — End: 2022-07-18
  Filled 2022-07-18: qty 250

## 2022-07-18 MED ORDER — APIXABAN 5 MG TABLET
10.0000 mg | ORAL_TABLET | Freq: Two times a day (BID) | ORAL | Status: DC
Start: 2022-07-18 — End: 2022-07-18
  Administered 2022-07-18: 10 mg via ORAL

## 2022-07-18 MED ORDER — HEPARIN (PORCINE) 25,000 UNIT/250 ML (100 UNIT/ML) IN DEXTROSE 5 % IV
17.9640 [IU]/kg/h | INTRAVENOUS | Status: DC
Start: 2022-07-18 — End: 2022-07-18
  Administered 2022-07-18: 0 [IU]/kg/h via INTRAVENOUS
  Administered 2022-07-18: 17.964 [IU]/kg/h via INTRAVENOUS

## 2022-07-18 MED ORDER — HEPARIN (PORCINE) 25,000 UNIT/250 ML (100 UNIT/ML) IN DEXTROSE 5 % IV
18.0000 [IU]/kg/h | INTRAVENOUS | Status: DC
Start: 2022-07-18 — End: 2022-07-18
  Administered 2022-07-18: 18 [IU]/kg/h via INTRAVENOUS
  Administered 2022-07-18: 0 [IU]/kg/h via INTRAVENOUS

## 2022-07-18 MED ORDER — HEPARIN (PORCINE) 5,000 UNITS/ML BOLUS
80.0000 [IU]/kg | Freq: Once | INTRAMUSCULAR | Status: AC
Start: 2022-07-18 — End: 2022-07-18
  Administered 2022-07-18: 5000 [IU] via INTRAVENOUS

## 2022-07-18 MED ORDER — POTASSIUM CHLORIDE ER 20 MEQ TABLET,EXTENDED RELEASE(PART/CRYST)
ORAL_TABLET | ORAL | Status: AC
Start: 2022-07-18 — End: 2022-07-18
  Filled 2022-07-18: qty 2

## 2022-07-18 MED ORDER — APIXABAN 5 MG TABLET
ORAL_TABLET | ORAL | Status: AC
Start: 2022-07-18 — End: 2022-07-18
  Filled 2022-07-18: qty 2

## 2022-07-18 NOTE — ED Nurses Note (Signed)
Patient discharged home with family/self with treatment of initial complaint upon arrival to ER.  AVS reviewed by physician with patient/care giver.  A written copy of the AVS and discharge instructions was given to the patient/care giver. Scripts handed to patient/care giver or sent to pharmacy indicated on AVS print out. Pt verbalized understanding of taking any medications as prescribed. Questions and concerns sufficiently answered as needed.  Patient/care giver encouraged to follow up with PCP as indicated.  In the event of an emergency, patient/care giver instructed to call 911 or go to the nearest emergency room. No other concerns voiced at time of discharge to physician or nurse. Respiration even and unlabored. Pt wheeled out of ED.

## 2022-07-18 NOTE — ED Triage Notes (Signed)
Pt seen here on Friday. [facility]. Heparin drip until yesterday at 1600. I was DC yesterday evening. They let me stay in the room until Medicaid to take me home. I was given a prescription for Eliquis. I have [place] medicaid. It does not pay for my medication if a [facility] writes it. They did not give me anything this morning. I left at 0745. I've been short of breath all day. It's worse when I walk around. My back hurts in my lungs. I came in to see if I can get a prescription sent to [facility] for my Eliquis. If it is written in [facility]  my insurance will pay for it. "

## 2022-07-18 NOTE — ED Nurses Note (Signed)
Pt assisted to bathroom with 1 assist and cane. PT helped back to back with no difficulties. Pt offered any comfort measures (food, water, repositioning, restroom, etc.). Pt expresses no other concerns or needs at this time. Plan of care ongoing. Call light in reach.

## 2022-07-18 NOTE — ED Nurses Note (Signed)
Spoke with patient about CT results and the reason for heparin drip. Talked to patient about being admitted.

## 2022-07-18 NOTE — ED Nurses Note (Signed)
PT given sandwich tray, denies any other needs/concerns. Plan of care ongoing.

## 2022-07-18 NOTE — ED Provider Notes (Signed)
[facility], [facility] Emergency Department  ED Primary Provider Note  History of Present Illness   Chief Complaint   Patient presents with    Shortness of Breath     JASZMIN DIETERT is a 57 y.o. female who had concerns including Shortness of Breath.  Arrival: The patient arrived by Car    Patient is a 57 year old female to the emergency room after being discharged from [facility] hospital pulmonary embolism, NSTEMI.  Patient was seen at [facility] on April 5th  and diagnosed with pulmonary embolism with right heart strain.  Patient was started on heparin drip and admitted to [facility].  Patient was there on heparin drip and had echocardiogram in hospital.  Patient states she does not know the results but was discharged from the facility on Eliquis.  Patient was unable to pick up Eliquis due to Medicaid being [place] and the prescribing facility being in [place].  Patient states she has not had an anticoagulants since yesterday at 1630.  Patient states continued shortness of breath and back pain.  Patient does have a history of asthma and anxiety.      History Reviewed This Encounter: Medical History  Surgical History  Family History  Social History    Physical Exam   ED Triage Vitals   BP (Non-Invasive) 07/18/22 1446 (!) 130/96   Heart Rate 07/18/22 1446 89   Respiratory Rate 07/18/22 1446 (!) 22   Temperature 07/18/22 1446 37.1 C (98.8 F)   SpO2 07/18/22 1446 97 %   Weight 07/18/22 1800 113 kg (249 lb 1.9 oz)   Height --      Physical Exam  Vitals and nursing note reviewed.   Constitutional:       General: She is not in acute distress.     Appearance: She is well-developed.   HENT:      Head: Normocephalic and atraumatic.   Eyes:      Conjunctiva/sclera: Conjunctivae normal.   Cardiovascular:      Rate and Rhythm: Normal rate and regular rhythm.      Heart sounds: No murmur heard.  Pulmonary:      Effort: Pulmonary effort is normal. No respiratory  distress.      Breath sounds: Normal breath sounds.   Abdominal:      Palpations: Abdomen is soft.      Tenderness: There is no abdominal tenderness.   Musculoskeletal:         General: No swelling.      Cervical back: Neck supple.   Skin:     General: Skin is warm and dry.      Capillary Refill: Capillary refill takes less than 2 seconds.   Neurological:      Mental Status: She is alert.   Psychiatric:         Mood and Affect: Mood normal.       Patient Data     Labs Ordered/Reviewed   COMPREHENSIVE METABOLIC PANEL, NON-FASTING - Abnormal; Notable for the following components:       Result Value    POTASSIUM 3.3 (*)     CREATININE 1.10 (*)     ESTIMATED GFR 59 (*)     All other components within normal limits    Narrative:     Estimated Glomerular Filtration Rate (eGFR) is calculated using the CKD-EPI (2021) equation, intended for patients 82 years of age and older. If gender is not documented or "unknown", there will be  no eGFR calculation.   TROPONIN-I - Abnormal; Notable for the following components:    TROPONIN I 62 (*)     All other components within normal limits    Narrative:     Values received on females ranging between 12-15 ng/L MUST include the next serial troponin to review changes in the delta differences as the reference range for the Access II chemistry analyzer is lower than the established reference range.     TROPONIN-I - Abnormal; Notable for the following components:    TROPONIN I 62 (*)     All other components within normal limits    Narrative:     Values received on females ranging between 12-15 ng/L MUST include the next serial troponin to review changes in the delta differences as the reference range for the Access II chemistry analyzer is lower than the established reference range.     TROPONIN-I - Abnormal; Notable for the following components:    TROPONIN I 62 (*)     All other components within normal limits    Narrative:     Values received on females ranging between 12-15 ng/L MUST  include the next serial troponin to review changes in the delta differences as the reference range for the Access II chemistry analyzer is lower than the established reference range.     PT/INR - Abnormal; Notable for the following components:    PROTHROMBIN TIME 14.4 (*)     INR 1.24 (*)     All other components within normal limits    Narrative:     INR OF 2.0-3.0  RECOMMENDED FOR: PROPHYLAXIS/TREATMENT OF VENEOUS THROMBOSIS, PULMONARY EMBOLISM, PREVENTION OF SYSTEMIC EMBOLISM FROM ATRIAL FIBRILATION, MYOCARDIAL INFARCTION.    INR OF 2.5-3.5  RECOMMENDED FOR MECHANICAL PROSTHETIC HEART VALVES, RECURRENT SYSTEMIC EMBOLISM, RECURRENT MYOCARDIAL INFARCTION.     NT-PROBNP - Abnormal; Notable for the following components:    NT-PROBNP 1,484 (*)     All other components within normal limits   CBC WITH DIFF - Abnormal; Notable for the following components:    WBC 15.2 (*)     RDW 18.0 (*)     NEUTROPHIL # 10.26 (*)     All other components within normal limits   ARTERIAL BLOOD GAS/LACTATE - Abnormal; Notable for the following components:    PH (ARTERIAL) 7.50 (*)     PCO2 (ARTERIAL) 30 (*)     PO2 (ARTERIAL) 59 (*)     All other components within normal limits   PTT (PARTIAL THROMBOPLASTIN TIME) - Normal   CBC/DIFF    Narrative:     The following orders were created for panel order CBC/DIFF.  Procedure                               Abnormality         Status                     ---------                               -----------         ------                     CBC WITH [id number][[id number]]                Abnormal  Final result                 Please view results for these tests on the individual orders.     CT ANGIO CHEST W IV CONTRAST   Final Result by Edi, Radresults In (04/07 1817)   Numerous bilateral pulmonary emboli throughout all lobes with components extending into the main branches. The overall distribution is similar to 2 days prior. No evidence of right heart strain.         One or more dose reduction  techniques were used (e.g., Automated exposure control, adjustment of the mA and/or kV according to patient size, use of iterative reconstruction technique).         Radiologist location ID: [id number]           Medical Decision Making        Medical Decision Making  57 year old female to the emergency department after [facility]  Patient complaining of shortness of breath that increased with exertion that is at baseline since her recent diagnosis.  Patient was diagnosed with pulmonary embolisms with right heart strain on the 5th of April.  Patient is continuing to complain of shortness of breath after discharge and was unable to pick up her Eliquis.  CT angiogram of the chest ordered for evaluation showing continued pulmonary embolisms in bilateral branches.  Patient has no heart strain.  Elevated troponin that is trending down from NSTEMI 2 days prior to arrival.  ABG has improved from [facility].  Discussed admission with hospitalist Dr. Wallace CullensGray states that patient does not meet admission criteria due to improvement of pulmonary embolism without anticoagulation at home.  Suggest anticoagulated with Eliquis in ED happened patient follow up with primary care provider.  Discussed with patient and patient states that she would be agreeable with strict return precautions.  Patient to return to emergency department if any worsening shortness breath or chest pain and placed on Eliquis per PE protocol.  Patient discharged to follow up with PCP on Monday otherwise.    Amount and/or Complexity of Data Reviewed  Labs: ordered.  Radiology: ordered.  ECG/medicine tests: ordered.    Risk  Prescription drug management.  Decision regarding hospitalization.      ED Course as of 07/18/22 2048   Sun Jul 18, 2022   1734 BP (Non-Invasive)(!): 130/96   1936 MAP (Non-Invasive): 112 mmHG         Medications Administered in the ED   potassium chloride (K-DUR) extended release tablet (40 mEq Oral Given  07/18/22 1659)   iohexol (OMNIPAQUE 350) infusion (100 mL Intravenous Given 07/18/22 1758)   heparin 5,000 units/mL initial IV BOLUS (has no administration in time range)     Clinical Impression   Pulmonary embolism, unspecified chronicity, unspecified pulmonary embolism type, unspecified whether acute cor pulmonale present (CMS HCC) (Primary)       Disposition: Discharged

## 2022-07-19 DIAGNOSIS — I517 Cardiomegaly: Secondary | ICD-10-CM

## 2022-07-19 LAB — ECG 12 LEAD
Atrial Rate: 99 {beats}/min
Calculated P Axis: 47 degrees
Calculated R Axis: -29 degrees
Calculated T Axis: 24 degrees
PR Interval: 170 ms
QRS Duration: 86 ms
QT Interval: 364 ms
QTC Calculation: 467 ms
Ventricular rate: 99 {beats}/min

## 2022-07-20 DIAGNOSIS — R9431 Abnormal electrocardiogram [ECG] [EKG]: Secondary | ICD-10-CM

## 2022-07-20 LAB — ECG 12 LEAD
Atrial Rate: 91 {beats}/min
Calculated P Axis: 43 degrees
Calculated R Axis: -42 degrees
Calculated T Axis: 54 degrees
PR Interval: 140 ms
QRS Duration: 88 ms
QT Interval: 384 ms
QTC Calculation: 472 ms
Ventricular rate: 91 {beats}/min

## 2022-07-21 ENCOUNTER — Ambulatory Visit (HOSPITAL_COMMUNITY): Payer: Self-pay

## 2022-07-23 ENCOUNTER — Ambulatory Visit (HOSPITAL_COMMUNITY): Payer: Self-pay

## 2022-07-26 ENCOUNTER — Other Ambulatory Visit: Payer: Self-pay

## 2022-07-26 ENCOUNTER — Telehealth (RURAL_HEALTH_CENTER): Payer: Self-pay | Admitting: INTERNAL MEDICINE

## 2022-07-26 ENCOUNTER — Encounter (HOSPITAL_COMMUNITY): Payer: Self-pay

## 2022-07-26 ENCOUNTER — Ambulatory Visit
Admission: RE | Admit: 2022-07-26 | Discharge: 2022-07-26 | Disposition: A | Discharge status: Home / Self Care | Payer: Medicaid Other | Source: Ambulatory Visit | Attending: INTERNAL MEDICINE | Admitting: INTERNAL MEDICINE

## 2022-07-26 DIAGNOSIS — Z1231 Encounter for screening mammogram for malignant neoplasm of breast: Secondary | ICD-10-CM | POA: Insufficient documentation

## 2022-07-27 ENCOUNTER — Encounter (RURAL_HEALTH_CENTER): Payer: Self-pay | Admitting: INTERNAL MEDICINE

## 2022-07-27 ENCOUNTER — Ambulatory Visit (RURAL_HEALTH_CENTER): Payer: Medicaid Other | Attending: INTERNAL MEDICINE | Admitting: INTERNAL MEDICINE

## 2022-07-27 VITALS — BP 116/70 | HR 78 | Resp 18 | Ht 68.0 in | Wt 298.0 lb

## 2022-07-27 DIAGNOSIS — I2693 Single subsegmental pulmonary embolism without acute cor pulmonale: Secondary | ICD-10-CM | POA: Insufficient documentation

## 2022-07-27 DIAGNOSIS — Z1231 Encounter for screening mammogram for malignant neoplasm of breast: Secondary | ICD-10-CM

## 2022-07-27 DIAGNOSIS — K581 Irritable bowel syndrome with constipation: Secondary | ICD-10-CM | POA: Insufficient documentation

## 2022-07-27 DIAGNOSIS — E876 Hypokalemia: Secondary | ICD-10-CM | POA: Insufficient documentation

## 2022-07-27 MED ORDER — LACTULOSE 10 GRAM/15 ML ORAL SOLUTION
30.0000 mL | Freq: Every day | ORAL | 0 refills | Status: AC
Start: 2022-07-27 — End: 2022-08-26

## 2022-07-27 MED ORDER — POTASSIUM CHLORIDE 20 MEQ/15 ML ORAL LIQUID
20.0000 meq | Freq: Every morning | ORAL | 1 refills | Status: DC
Start: 2022-07-27 — End: 2022-10-06

## 2022-07-27 MED ORDER — APIXABAN 5 MG TABLET
5.0000 mg | ORAL_TABLET | Freq: Two times a day (BID) | ORAL | 1 refills | Status: DC
Start: 2022-07-27 — End: 2022-10-06

## 2022-07-27 NOTE — Nursing Note (Signed)
Patient is here for her hospital follow up. Patient reports she is doing better,

## 2022-07-27 NOTE — Progress Notes (Signed)
[facility]  [address]  [place], [place]  [address]  Phone: [phone] Fax: [phone]    Name: Rhonda Gentry                       Date of Birth: [birth year]   MRN:  [id number]                         Date of visit: 07/27/2022     Chief Complaint: Hospital Follow Up [facility] for blood clots in lungs)    Current Outpatient Medications   Medication Sig    ADVAIR [facility] 115-21 mcg/actuation Inhalation oral inhaler Take 2 Puffs by inhalation Once a day for 180 days    aerochamber with flowsignal spacer (AEROCHAMBER) Inhaler Use device with inhaler each time. Indications: asthma    albuterol sulfate (PROVENTIL OR VENTOLIN OR PROAIR) 90 mcg/actuation Inhalation oral inhaler Take 2 Puffs by inhalation Every 6 hours    apixaban (ELIQUIS) 5 mg Oral Tablet Take 1 Tablet (5 mg total) by mouth Twice daily Indications: a clot in the lung, blood clot in a deep vein of the extremities    dicyclomine (BENTYL) 10 mg Oral Capsule Take 1 Capsule (10 mg total) by mouth Four times a day Indications: irritable colon    dilTIAZem (CARDIZEM CD) 180 mg Oral Capsule, Sust. Release 24 hr Take 1 Capsule (180 mg total) by mouth Once a day    ezetimibe (ZETIA) 10 mg Oral Tablet Take 1 Tablet (10 mg total) by mouth Every evening    famotidine (PEPCID) 40 mg Oral Tablet Take 1 Tablet (40 mg total) by mouth Twice daily for 180 days    fluticasone propionate (FLONASE) 50 mcg/actuation Nasal Spray, Suspension Administer 1 Spray into each nostril Once a day    hydroCHLOROthiazide (HYDRODIURIL) 25 mg Oral Tablet Take 1 Tablet (25 mg total) by mouth Once a day for 180 days    lactulose (ENULOSE) 10 gram/15 mL Oral Solution Take 30 mL by mouth Once a day for 30 days    montelukast (SINGULAIR) 10 mg Oral Tablet Take 1 Tablet (10 mg total) by mouth Once a day    potassium chloride 20 mEq/15 mL Oral Liquid Take 15 mL (20 mEq total) by mouth Every morning with breakfast    solifenacin (VESICARE) 10 mg Oral Tablet Take 1 Tablet (10  mg total) by mouth Once a day for 180 days       Patient Active Problem List    Diagnosis Date Noted    Single subsegmental pulmonary embolism without acute cor pulmonale (CMS HCC) 07/27/2022    Hypokalemia 07/27/2022    Irritable bowel 12/31/2021    Primary hypertension 06/23/2021    Other hyperlipidemia 06/23/2021    Chronic back pain 06/23/2021    Mild asthma with acute exacerbation 06/23/2021    Stress incontinence of urine 06/23/2021       Subjective:   Patient is here for hospital f/u.    Date: 07-16-22  Site: [facility] [place]  Dx: Pulmonary embolism    Patient presented to ED with CC of dyspnea x2 days, not improved with Advair. CT showed pulmonary embolism. [facility] transferred to [facility] as no hospital beds available at [facility].     Date: 07-16-22 to 07-17-22  [facility]  Dx: Pulmonary embolism    Patient discharged with Eliquis.    Date: 07-18-22  Site: [facility] [place]  Dx: Pulmonary embolism  Patient was discharged from [facility] day prior on Eliquis. However there was an issue with insurance coverage at the pharmacy, so patient presented to ED when she could not fill the Rx.   Rx sent to pharmacy.    We are unable to obtain records from [employer]. They will not provide them, even upon faxed request.     ROS:  10 systems reviewed and were negative except as noted.   + dyspnea  + back pain    Objective :  BP 116/70 (Site: Left, Patient Position: Sitting, Cuff Size: Adult)   Pulse 78   Resp 18   Ht 1.727 m ( )   Wt 135 kg (298 lb)   SpO2 99%   BMI 45.31 kg/m       General:  appears in good health  Lungs:  Clear to auscultation bilaterally.   Cardiovascular:  regular rate and rhythm  Neurologic:  Grossly normal  Psychiatric:  rapid speech    Data reviewed:    Assessment/Plan:  Assessment/Plan   1. Single subsegmental pulmonary embolism without acute cor pulmonale (CMS HCC)    2. Irritable bowel syndrome with constipation    3. Hypokalemia      PE  Continue Eliquis  Since appears to be  unprovoked, patient will need hypercoagulable workup 6-12 months before permanent discontinuation of treatment  Plan on CT chest to confirm resolution  May need lifetime therapy  Will refill Eliquis    IBSC  Try lactulose  Has failed Miralax  Can consider orals if fails lactulose    Hypokalemia  Switch to liquid. Patient having trouble swallowing the pills    Will also give patient order for cane. Putting PT order on hold for right now due to the PE and presumed DVT.       Terald Sleeper, DO, [facility]  Internal Medicine

## 2022-09-20 ENCOUNTER — Ambulatory Visit (RURAL_HEALTH_CENTER): Payer: Self-pay | Admitting: INTERNAL MEDICINE

## 2022-09-28 ENCOUNTER — Ambulatory Visit (RURAL_HEALTH_CENTER): Payer: Self-pay | Admitting: INTERNAL MEDICINE
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
## 2022-10-06 ENCOUNTER — Other Ambulatory Visit (RURAL_HEALTH_CENTER): Payer: Self-pay | Admitting: INTERNAL MEDICINE

## 2022-10-06 ENCOUNTER — Ambulatory Visit (RURAL_HEALTH_CENTER): Payer: Medicaid Other | Attending: INTERNAL MEDICINE | Admitting: INTERNAL MEDICINE

## 2022-10-06 ENCOUNTER — Encounter (RURAL_HEALTH_CENTER): Payer: Self-pay | Admitting: INTERNAL MEDICINE

## 2022-10-06 ENCOUNTER — Ambulatory Visit: Payer: Medicaid Other | Attending: INTERNAL MEDICINE | Admitting: INTERNAL MEDICINE

## 2022-10-06 ENCOUNTER — Other Ambulatory Visit: Payer: Self-pay

## 2022-10-06 VITALS — BP 148/80 | HR 58 | Resp 16 | Ht 68.0 in | Wt 302.0 lb

## 2022-10-06 DIAGNOSIS — I10 Essential (primary) hypertension: Secondary | ICD-10-CM | POA: Insufficient documentation

## 2022-10-06 DIAGNOSIS — M545 Low back pain, unspecified: Secondary | ICD-10-CM | POA: Insufficient documentation

## 2022-10-06 DIAGNOSIS — I2693 Single subsegmental pulmonary embolism without acute cor pulmonale: Secondary | ICD-10-CM | POA: Insufficient documentation

## 2022-10-06 DIAGNOSIS — G8929 Other chronic pain: Secondary | ICD-10-CM | POA: Insufficient documentation

## 2022-10-06 DIAGNOSIS — E7849 Other hyperlipidemia: Secondary | ICD-10-CM | POA: Insufficient documentation

## 2022-10-06 DIAGNOSIS — E876 Hypokalemia: Secondary | ICD-10-CM | POA: Insufficient documentation

## 2022-10-06 DIAGNOSIS — J4520 Mild intermittent asthma, uncomplicated: Secondary | ICD-10-CM | POA: Insufficient documentation

## 2022-10-06 DIAGNOSIS — N393 Stress incontinence (female) (male): Secondary | ICD-10-CM | POA: Insufficient documentation

## 2022-10-06 DIAGNOSIS — K581 Irritable bowel syndrome with constipation: Secondary | ICD-10-CM | POA: Insufficient documentation

## 2022-10-06 LAB — CBC WITH DIFF
BASOPHIL #: 0.1 10*3/uL (ref 0.00–0.10)
BASOPHIL %: 1 % (ref 0–1)
EOSINOPHIL #: 0.1 10*3/uL (ref 0.00–0.50)
EOSINOPHIL %: 1 %
HCT: 33 % (ref 31.2–41.9)
HGB: 10.7 g/dL — ABNORMAL LOW (ref 10.9–14.3)
LYMPHOCYTE #: 1.8 10*3/uL (ref 1.00–3.00)
LYMPHOCYTE %: 31 % (ref 16–44)
MCH: 26.7 pg (ref 24.7–32.8)
MCHC: 32.4 g/dL (ref 32.3–35.6)
MCV: 82.3 fL (ref 75.5–95.3)
MONOCYTE #: 0.5 10*3/uL (ref 0.30–1.00)
MONOCYTE %: 9 % (ref 5–13)
MPV: 8.1 fL (ref 7.9–10.8)
NEUTROPHIL #: 3.3 10*3/uL (ref 1.85–7.80)
NEUTROPHIL %: 58 % (ref 43–77)
PLATELETS: 366 10*3/uL (ref 140–440)
RBC: 4.01 10*6/uL (ref 3.63–4.92)
RDW: 15.6 % (ref 12.3–17.7)
WBC: 5.7 10*3/uL (ref 3.8–11.8)

## 2022-10-06 LAB — LIPID PANEL
CHOL/HDL RATIO: 5.1
CHOLESTEROL: 229 mg/dL — ABNORMAL HIGH (ref <200)
HDL CHOL: 45 mg/dL (ref >=40)
LDL CALC: 171 mg/dL — ABNORMAL HIGH (ref 0–100)
TRIGLYCERIDES: 66 mg/dL (ref <=150)
VLDL CALC: 13 mg/dL (ref 0–50)

## 2022-10-06 LAB — COMPREHENSIVE METABOLIC PNL, FASTING
ALBUMIN/GLOBULIN RATIO: 1.2 (ref 0.8–1.4)
ALBUMIN: 4 g/dL (ref 3.5–5.7)
ALKALINE PHOSPHATASE: 66 U/L (ref 34–104)
ALT (SGPT): 18 U/L (ref 7–52)
ANION GAP: 6 mmol/L (ref 4–13)
AST (SGOT): 19 U/L (ref 13–39)
BILIRUBIN TOTAL: 0.5 mg/dL (ref 0.3–1.2)
BUN/CREA RATIO: 13 (ref 6–22)
BUN: 11 mg/dL (ref 7–25)
CALCIUM, CORRECTED: 9.3 mg/dL (ref 8.9–10.8)
CALCIUM: 9.3 mg/dL (ref 8.6–10.3)
CHLORIDE: 108 mmol/L — ABNORMAL HIGH (ref 98–107)
CO2 TOTAL: 27 mmol/L (ref 21–31)
CREATININE: 0.85 mg/dL (ref 0.60–1.30)
ESTIMATED GFR: 80 mL/min/{1.73_m2} (ref >59)
GLOBULIN: 3.3 (ref 2.9–5.4)
GLUCOSE: 79 mg/dL (ref 74–109)
OSMOLALITY, CALCULATED: 280 mOsm/kg (ref 270–290)
POTASSIUM: 3.5 mmol/L (ref 3.5–5.1)
PROTEIN TOTAL: 7.3 g/dL (ref 6.4–8.9)
SODIUM: 141 mmol/L (ref 136–145)

## 2022-10-06 LAB — MAGNESIUM: MAGNESIUM: 1.8 mg/dL — ABNORMAL LOW (ref 1.9–2.7)

## 2022-10-06 MED ORDER — POTASSIUM CHLORIDE 20 MEQ/15 ML ORAL LIQUID
20.0000 meq | Freq: Every morning | ORAL | 1 refills | Status: DC
Start: 2022-10-06 — End: 2024-03-05

## 2022-10-06 MED ORDER — DILTIAZEM CD 180 MG CAPSULE,EXTENDED RELEASE 24 HR
180.0000 mg | ORAL_CAPSULE | Freq: Every day | ORAL | 1 refills | Status: DC
Start: 2022-10-06 — End: 2024-03-05

## 2022-10-06 MED ORDER — DICYCLOMINE 10 MG CAPSULE
10.0000 mg | ORAL_CAPSULE | Freq: Four times a day (QID) | ORAL | 1 refills | Status: DC
Start: 2022-10-06 — End: 2024-03-05

## 2022-10-06 MED ORDER — SOLIFENACIN 10 MG TABLET
10.0000 mg | ORAL_TABLET | Freq: Every day | ORAL | 1 refills | Status: AC
Start: 2022-10-06 — End: 2023-04-04

## 2022-10-06 MED ORDER — FAMOTIDINE 40 MG TABLET
40.0000 mg | ORAL_TABLET | Freq: Two times a day (BID) | ORAL | 1 refills | Status: AC
Start: 2022-10-06 — End: 2023-04-04

## 2022-10-06 MED ORDER — HYDROCHLOROTHIAZIDE 25 MG TABLET
25.0000 mg | ORAL_TABLET | Freq: Every day | ORAL | 1 refills | Status: DC
Start: 2022-10-06 — End: 2022-10-06

## 2022-10-06 MED ORDER — ALBUTEROL SULFATE HFA 90 MCG/ACTUATION AEROSOL INHALER
2.0000 | INHALATION_SPRAY | Freq: Four times a day (QID) | RESPIRATORY_TRACT | 1 refills | Status: DC
Start: 2022-10-06 — End: 2024-03-05

## 2022-10-06 MED ORDER — FLUTICASONE PROPIONATE 50 MCG/ACTUATION NASAL SPRAY,SUSPENSION
1.0000 | Freq: Every day | NASAL | 1 refills | Status: DC
Start: 2022-10-06 — End: 2024-03-05

## 2022-10-06 MED ORDER — LISINOPRIL 40 MG TABLET
40.0000 mg | ORAL_TABLET | Freq: Every day | ORAL | 4 refills | Status: DC
Start: 2022-10-06 — End: 2024-03-05

## 2022-10-06 MED ORDER — APIXABAN 5 MG TABLET
5.0000 mg | ORAL_TABLET | Freq: Two times a day (BID) | ORAL | 1 refills | Status: DC
Start: 2022-10-06 — End: 2024-03-05

## 2022-10-06 MED ORDER — BUDESONIDE-FORMOTEROL HFA 160 MCG-4.5 MCG/ACTUATION AEROSOL INHALER
2.0000 | INHALATION_SPRAY | Freq: Two times a day (BID) | RESPIRATORY_TRACT | 1 refills | Status: DC
Start: 2022-10-06 — End: 2024-03-05

## 2022-10-06 MED ORDER — ADVAIR HFA 115 MCG-21 MCG/ACTUATION AEROSOL INHALER
2.0000 | INHALATION_SPRAY | Freq: Every day | RESPIRATORY_TRACT | 1 refills | Status: DC
Start: 2022-10-06 — End: 2022-10-06

## 2022-10-06 NOTE — Nursing Note (Signed)
Patient is here for her follow up. Patient reports no new issues.
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
## 2022-10-07 ENCOUNTER — Telehealth (RURAL_HEALTH_CENTER): Payer: Self-pay | Admitting: INTERNAL MEDICINE

## 2022-10-07 NOTE — Telephone Encounter (Signed)
Left message for pt to call.

## 2022-10-07 NOTE — Telephone Encounter (Signed)
Pt called back and was notified of results

## 2022-10-07 NOTE — Telephone Encounter (Signed)
-----   Message from [facility], DO sent at 10/07/2022  7:00 AM EDT -----  Potassium is coming up, but not normal. Definitely still needs to make sure she takes the potassium.  Cholesterol just a little too high. Watch diet more  Still a little anemic, but better than last year  Other labs ok

## 2022-10-08 LAB — LDL CHOLESTEROL, DIRECT: LDL DIRECT: 195 mg/dL — ABNORMAL HIGH (ref <100)

## 2022-11-03 IMAGING — CT CT HEAD W/O CM
3 series · 15 of 47 positions shown, 18 images · non-contrast
Comparison: October 15, 2016

CLINICAL DATA: Allergic reaction.

EXAM:
CT HEAD WITHOUT CONTRAST
TECHNIQUE: Contiguous axial images were obtained from the base of the skull
through the vertex without intravenous contrast.

[Series 2: head wo · axial · 0.44mm/px · z∈[+55,+180]mm · 9 of 30 slices shown, 12 images]
[im 3/30  brain]
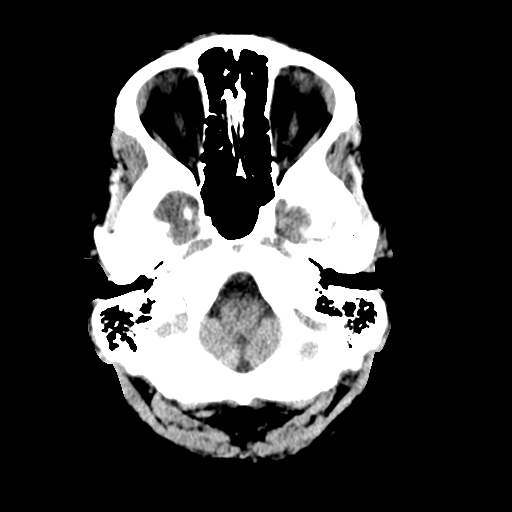
[im 3/30  bone]
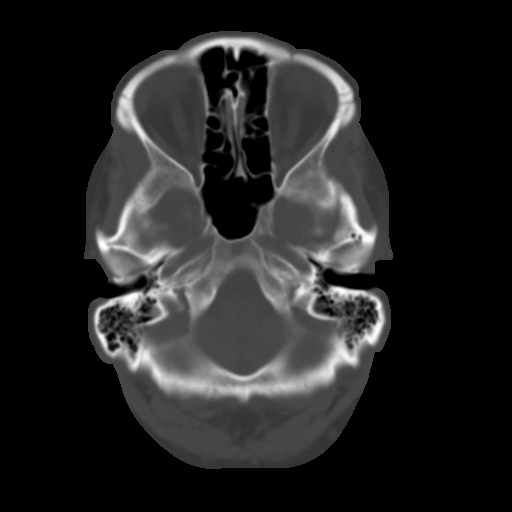
[im 6/30  brain]
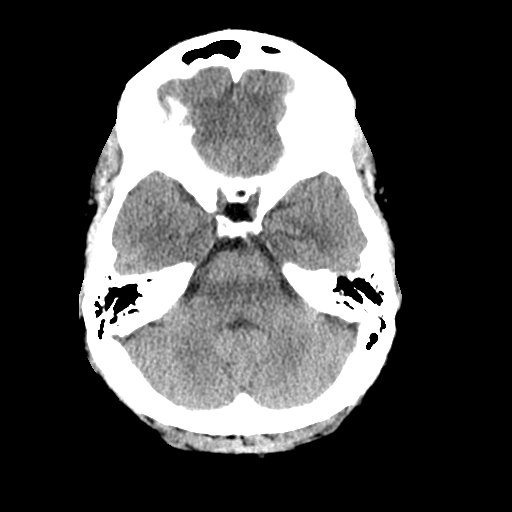
[im 9/30  brain]
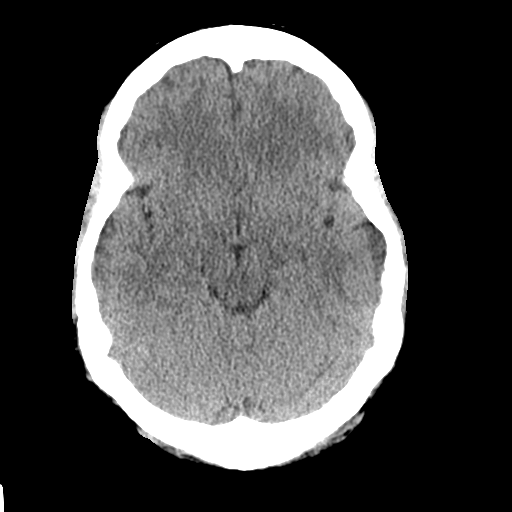
[im 12/30  brain]
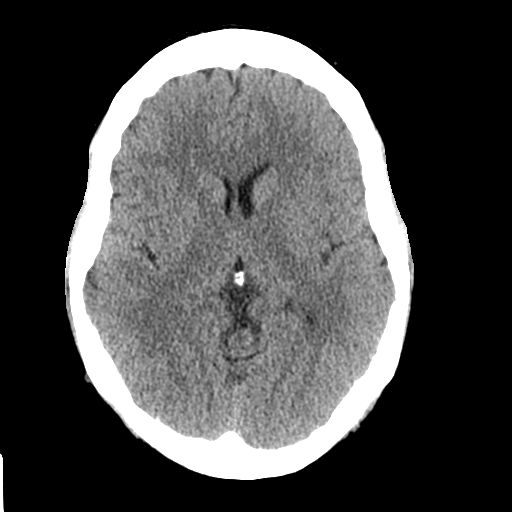
[im 16/30  brain]
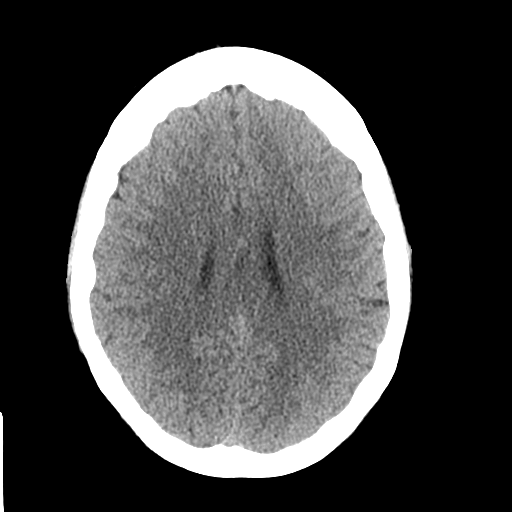
[im 16/30  bone]
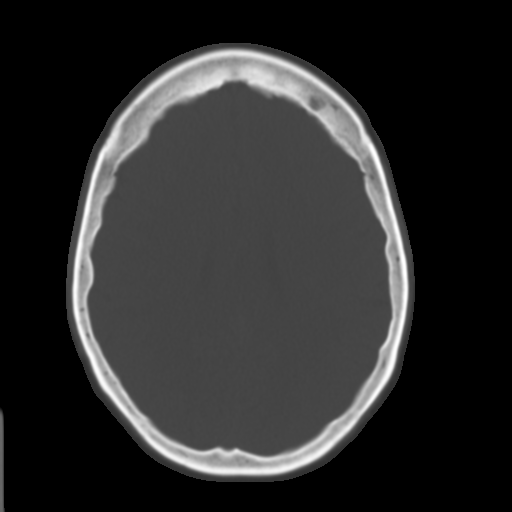
[im 19/30  brain]
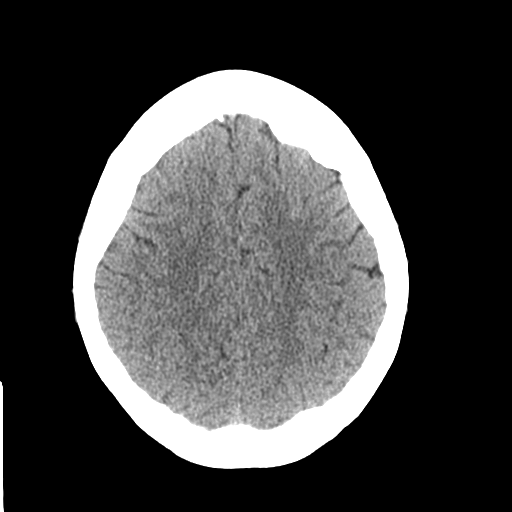
[im 22/30  brain]
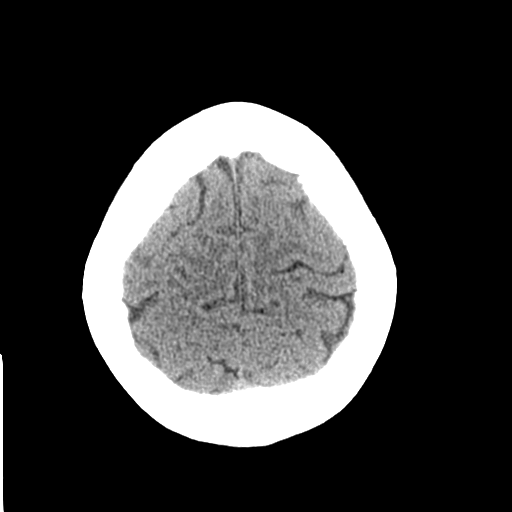
[im 25/30  brain]
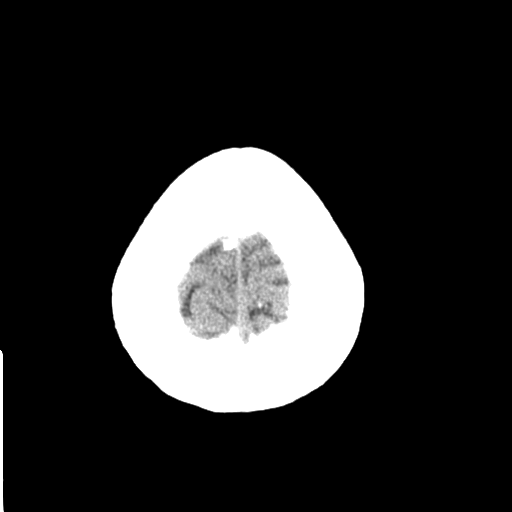
[im 28/30  brain]
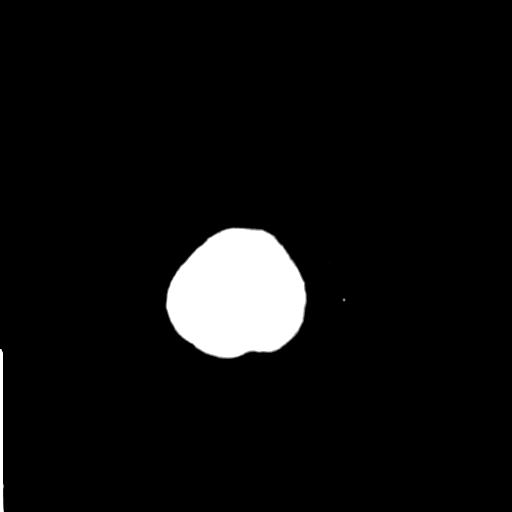
[im 28/30  bone]
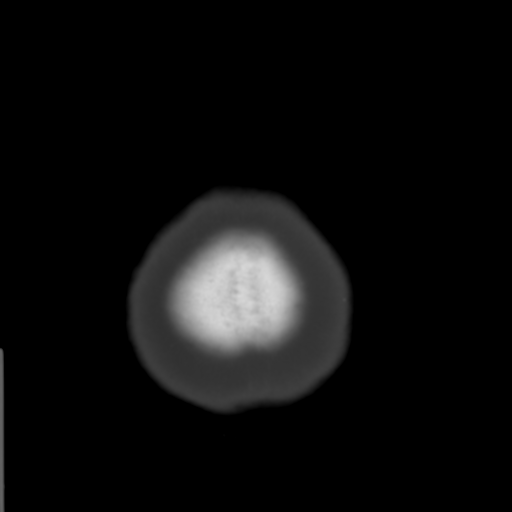

[Series 4: coronal soft tissue · coronal · 0.31mm/px · 3 of 69 slices shown]
[im 23/69  brain]
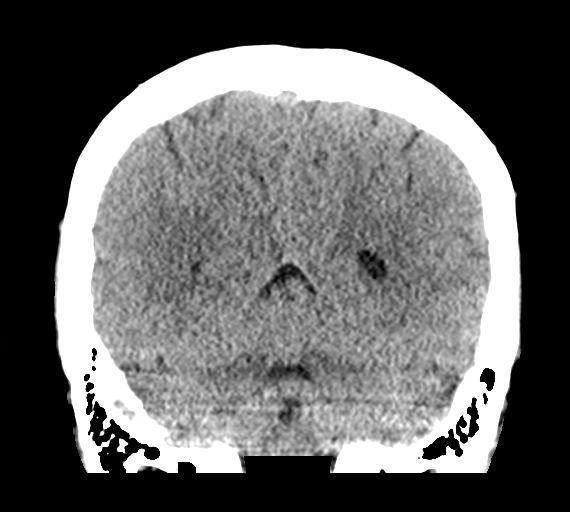
[im 31/69  brain]
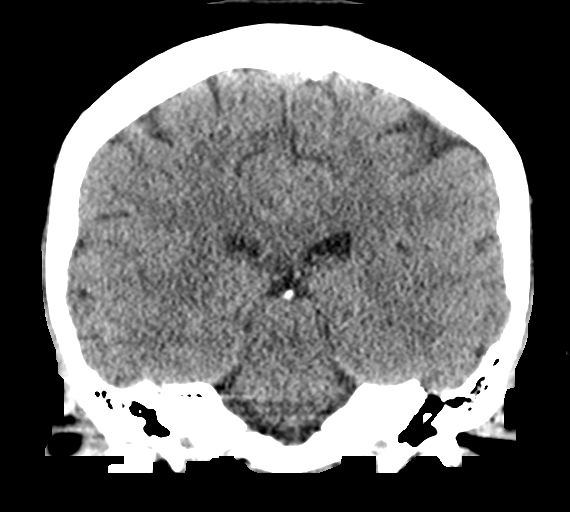
[im 38/69  brain]
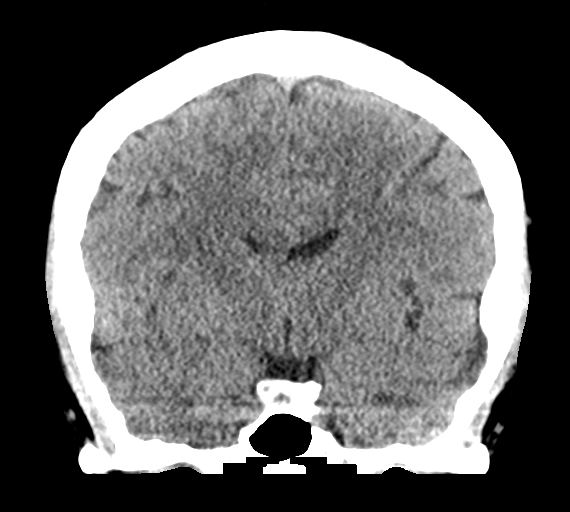

[Series 5: sagittal soft tissue · sagittal · 0.31mm/px · 3 of 54 slices shown]
[im 18/54  brain]
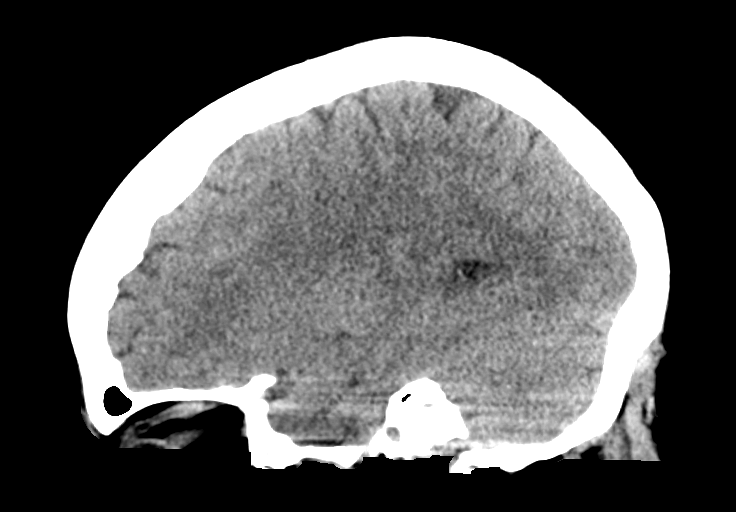
[im 27/54  brain]
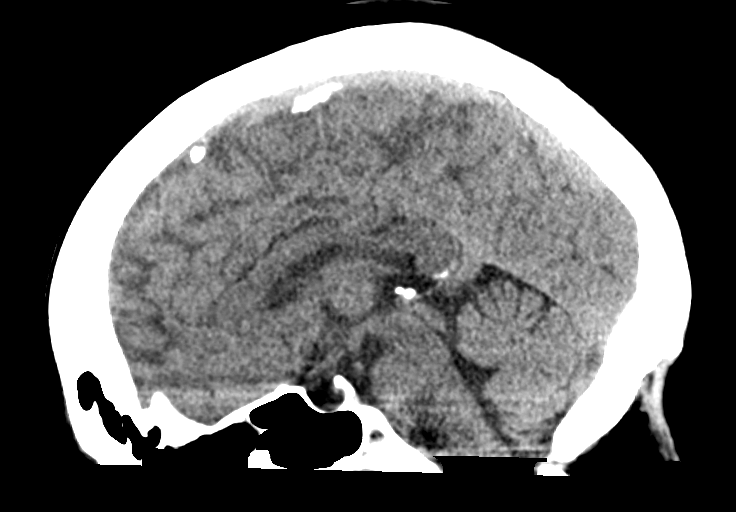
[im 36/54  brain]
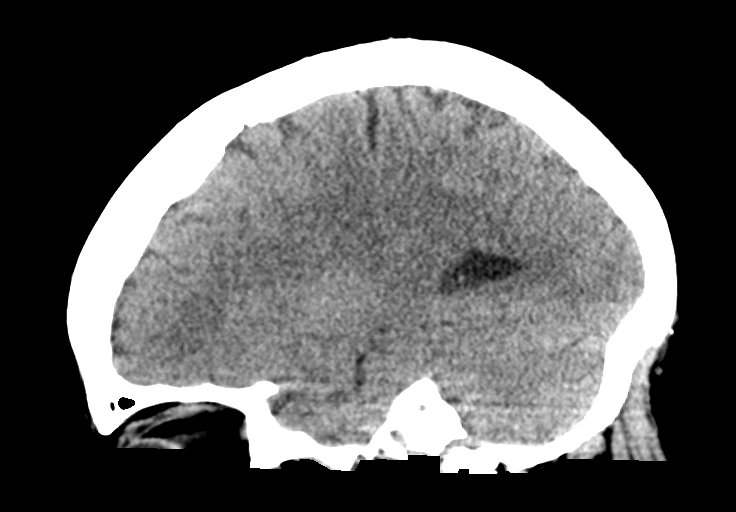

[15 of 47 positions shown; findings below may reference images not displayed]

FINDINGS: Brain: There is mild cerebral atrophy with widening of the
extra-axial spaces and ventricular dilatation.
There are areas of decreased attenuation within the white matter
tracts of the supratentorial brain, consistent with microvascular
disease changes.

Vascular: No hyperdense vessel or unexpected calcification.

Skull: Normal. Negative for fracture or focal lesion.

Sinuses/Orbits: No acute finding.

Other: None.
IMPRESSION: 1. Generalized cerebral atrophy.
2. No acute intracranial abnormality.

## 2023-01-10 ENCOUNTER — Ambulatory Visit (RURAL_HEALTH_CENTER): Payer: Self-pay | Admitting: INTERNAL MEDICINE

## 2023-02-09 ENCOUNTER — Telehealth (RURAL_HEALTH_CENTER): Payer: Self-pay | Admitting: INTERNAL MEDICINE

## 2023-02-09 NOTE — Telephone Encounter (Signed)
Patient is requesting referral to Dr. Janalyn Rouse.

## 2023-02-09 NOTE — Telephone Encounter (Signed)
I need to know why.  And she needs to get a follow up apt with [place] scheduled as well

## 2023-02-10 NOTE — Telephone Encounter (Signed)
Left message for patient to return call to the office.

## 2023-02-15 NOTE — Telephone Encounter (Signed)
I have tried multiple times to get patient with messages left for patient to return call to the office.

## 2023-07-28 ENCOUNTER — Ambulatory Visit
Admission: RE | Admit: 2023-07-28 | Discharge: 2023-07-28 | Disposition: A | Discharge status: Home / Self Care | Source: Ambulatory Visit | Attending: NURSE PRACTITIONER | Admitting: NURSE PRACTITIONER

## 2023-07-28 ENCOUNTER — Other Ambulatory Visit: Payer: Self-pay

## 2023-07-28 ENCOUNTER — Other Ambulatory Visit (HOSPITAL_COMMUNITY): Payer: Self-pay | Admitting: NURSE PRACTITIONER

## 2023-07-28 DIAGNOSIS — M545 Low back pain, unspecified: Secondary | ICD-10-CM

## 2023-08-25 ENCOUNTER — Other Ambulatory Visit (HOSPITAL_COMMUNITY): Payer: Self-pay | Admitting: NURSE PRACTITIONER

## 2023-08-25 DIAGNOSIS — Z1231 Encounter for screening mammogram for malignant neoplasm of breast: Secondary | ICD-10-CM

## 2023-09-08 ENCOUNTER — Ambulatory Visit
Admission: RE | Admit: 2023-09-08 | Discharge: 2023-09-08 | Disposition: A | Discharge status: Home / Self Care | Payer: Self-pay | Source: Ambulatory Visit | Attending: NURSE PRACTITIONER | Admitting: NURSE PRACTITIONER

## 2023-09-08 ENCOUNTER — Other Ambulatory Visit: Payer: Self-pay

## 2023-09-08 ENCOUNTER — Encounter (HOSPITAL_COMMUNITY): Payer: Self-pay

## 2023-09-08 DIAGNOSIS — Z1231 Encounter for screening mammogram for malignant neoplasm of breast: Secondary | ICD-10-CM | POA: Insufficient documentation

## 2023-09-12 ENCOUNTER — Other Ambulatory Visit (HOSPITAL_COMMUNITY): Payer: Self-pay | Admitting: NURSE PRACTITIONER

## 2023-09-12 DIAGNOSIS — R928 Other abnormal and inconclusive findings on diagnostic imaging of breast: Secondary | ICD-10-CM

## 2023-09-23 ENCOUNTER — Ambulatory Visit
Admission: RE | Admit: 2023-09-23 | Discharge: 2023-09-23 | Disposition: A | Discharge status: Home / Self Care | Payer: Self-pay | Source: Ambulatory Visit | Attending: NURSE PRACTITIONER

## 2023-09-23 ENCOUNTER — Other Ambulatory Visit: Payer: Self-pay

## 2023-09-23 DIAGNOSIS — R928 Other abnormal and inconclusive findings on diagnostic imaging of breast: Secondary | ICD-10-CM | POA: Insufficient documentation

## 2023-11-11 ENCOUNTER — Other Ambulatory Visit (RURAL_HEALTH_CENTER): Payer: Self-pay | Admitting: INTERNAL MEDICINE

## 2024-03-05 ENCOUNTER — Emergency Department (HOSPITAL_BASED_OUTPATIENT_CLINIC_OR_DEPARTMENT_OTHER)

## 2024-03-05 ENCOUNTER — Other Ambulatory Visit: Payer: Self-pay

## 2024-03-05 ENCOUNTER — Encounter (HOSPITAL_BASED_OUTPATIENT_CLINIC_OR_DEPARTMENT_OTHER): Payer: Self-pay

## 2024-03-05 ENCOUNTER — Emergency Department
Admission: EM | Admit: 2024-03-05 | Discharge: 2024-03-05 | Disposition: A | Discharge status: Home / Self Care | Attending: Emergency Medicine | Admitting: Emergency Medicine

## 2024-03-05 DIAGNOSIS — M25462 Effusion, left knee: Secondary | ICD-10-CM

## 2024-03-05 DIAGNOSIS — S86912A Strain of unspecified muscle(s) and tendon(s) at lower leg level, left leg, initial encounter: Secondary | ICD-10-CM | POA: Insufficient documentation

## 2024-03-05 DIAGNOSIS — Z6841 Body Mass Index (BMI) 40.0 and over, adult: Secondary | ICD-10-CM | POA: Insufficient documentation

## 2024-03-05 DIAGNOSIS — X58XXXA Exposure to other specified factors, initial encounter: Secondary | ICD-10-CM | POA: Insufficient documentation

## 2024-03-05 DIAGNOSIS — M7989 Other specified soft tissue disorders: Secondary | ICD-10-CM

## 2024-03-05 DIAGNOSIS — M1712 Unilateral primary osteoarthritis, left knee: Secondary | ICD-10-CM | POA: Insufficient documentation

## 2024-03-05 DIAGNOSIS — S86919A Strain of unspecified muscle(s) and tendon(s) at lower leg level, unspecified leg, initial encounter: Secondary | ICD-10-CM

## 2024-03-05 MED ORDER — KETOROLAC 60 MG/2 ML INTRAMUSCULAR SOLUTION
60.0000 mg | INTRAMUSCULAR | Status: AC
Start: 2024-03-05 — End: 2024-03-05
  Administered 2024-03-05: 60 mg via INTRAMUSCULAR

## 2024-03-05 MED ORDER — KETOROLAC 10 MG TABLET
10.0000 mg | ORAL_TABLET | Freq: Four times a day (QID) | ORAL | 0 refills | Status: AC | PRN
Start: 2024-03-05 — End: ?

## 2024-03-05 MED ORDER — METHYLPREDNISOLONE ACETATE 80 MG/ML SUSPENSION FOR INJECTION
80.0000 mg | Freq: Once | INTRAMUSCULAR | Status: AC
Start: 2024-03-05 — End: 2024-03-05
  Administered 2024-03-05: 80 mg via INTRAMUSCULAR

## 2024-03-05 MED ORDER — METHYLPREDNISOLONE ACETATE 80 MG/ML SUSPENSION FOR INJECTION
INTRAMUSCULAR | Status: AC
Start: 2024-03-05 — End: 2024-03-05
  Filled 2024-03-05: qty 1

## 2024-03-05 MED ORDER — KETOROLAC 60 MG/2 ML INTRAMUSCULAR SOLUTION
INTRAMUSCULAR | Status: AC
Start: 2024-03-05 — End: 2024-03-05
  Filled 2024-03-05: qty 2

## 2024-03-05 NOTE — ED Nurses Note (Signed)
 Ace wrap applied to patients left knee, patient tolerated well. Patient educated on how to use

## 2024-03-05 NOTE — ED Nurses Note (Signed)
 Patient discharged home all instructions and test results gone over with patient including medications with verbalized understanding. Patient taken off unit in wheelchair

## 2024-03-05 NOTE — ED Provider Notes (Signed)
 [facility], [facility] - Emergency Department  ED Primary Provider Note  History of Present Illness   Chief Complaint   Patient presents with    Knee Pain     Patient c/o left knee pain since yesterday, states it felt like her knee was going to give out while standing     Rhonda Gentry is a 58 y.o. female who had concerns including Knee Pain.  Arrival: The patient arrived by Car patient is complaining of left knee pain since yesterday when she was standing in church singing.  She states it felt like her knee was going to give out.  Patient is morbidly obese.  Patient does have a history of arthritis in all her joints.  Patient also has a history of hypertension and hyperlipidemia.  She is a former smoker and drinks alcohol daily.  She also has a history of asthma in the past.  Patient does have chronic back pain as well.  Patient denies falling or injuring herself recently.    HPI  Review of Systems   Review of Systems   Constitutional:  Positive for activity change and appetite change. Negative for chills and fever.   HENT:  Negative for ear pain and sore throat.    Eyes:  Negative for pain and visual disturbance.   Respiratory:  Negative for cough and shortness of breath.    Cardiovascular:  Negative for chest pain and palpitations.   Gastrointestinal:  Negative for abdominal pain and vomiting.   Genitourinary:  Negative for dysuria and hematuria.   Musculoskeletal:  Positive for arthralgias, gait problem, joint swelling and myalgias. Negative for back pain.   Skin:  Negative for color change and rash.   Neurological:  Negative for seizures and syncope.   All other systems reviewed and are negative.     Historical Data   History Reviewed This Encounter:     Physical Exam   ED Triage Vitals [03/05/24 1749]   BP (Non-Invasive) (!) 164/98   Heart Rate 77   Respiratory Rate 20   Temperature 37.1 C (98.7 F)   SpO2 97 %   Weight (!) 147 kg (323 lb)   Height 1.702 m (5' 7)     Physical  Exam  Vitals and nursing note reviewed.   Constitutional:       General: She is not in acute distress.     Appearance: Normal appearance. She is well-developed. She is obese.   HENT:      Head: Normocephalic and atraumatic.      Right Ear: External ear normal.      Left Ear: External ear normal.      Nose: Nose normal.      Mouth/Throat:      Mouth: Mucous membranes are dry.   Eyes:      Extraocular Movements: Extraocular movements intact.      Conjunctiva/sclera: Conjunctivae normal.      Pupils: Pupils are equal, round, and reactive to light.   Cardiovascular:      Rate and Rhythm: Normal rate and regular rhythm.      Pulses: Normal pulses.      Heart sounds: Normal heart sounds. No murmur heard.  Pulmonary:      Effort: Pulmonary effort is normal. No respiratory distress.      Breath sounds: Normal breath sounds.   Abdominal:      General: Bowel sounds are normal.      Palpations: Abdomen is soft.  Tenderness: There is no abdominal tenderness.   Musculoskeletal:         General: Tenderness present. No swelling.      Cervical back: Normal range of motion and neck supple.      Comments: Positive tenderness over the patella ligaments superior and inferior.  There was no swelling or ecchymosis.  There was no laxity of the joint.   Skin:     General: Skin is warm and dry.      Capillary Refill: Capillary refill takes less than 2 seconds.   Neurological:      General: No focal deficit present.      Mental Status: She is alert and oriented to person, place, and time.   Psychiatric:         Mood and Affect: Mood normal.         Behavior: Behavior normal.         Thought Content: Thought content normal.       Patient Data   Labs Ordered/Reviewed - No data to display  XR KNEE LEFT 4 OR MORE VIEWS   Final Result by Edi, Radresults In (11/24 1811)   Soft tissue swelling. Small joint effusion. No fracture identified.                Radiologist location ID: [id number]           Medical Decision Making        Medical  Decision Making  Patient is 58 year old black female complaining of left knee pain since yesterday 1 standing in church.  She denies any recent fall or injury.  She states her knee felt like it was going to give out.  Patient is morbidly obese.  She does have arthritis throughout her body according to her.  She also has a history of hypertension and high cholesterol.  Patient will have an x-ray of her left knee.  She will then be treated for results of the testing and be discharged home.  Patient will follow up with PMD in the next 2-3 days.    Amount and/or Complexity of Data Reviewed  Radiology: ordered.    Risk  Prescription drug management.             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
